# Patient Record
Sex: Male | Born: 1980 | Race: Asian | Hispanic: No | Marital: Married | Smoking: Never smoker
Health system: Southern US, Community
[De-identification: ages and names within clinical notes are randomized; demographics above are authoritative.]

---

## 2008-04-03 ENCOUNTER — Emergency Department (HOSPITAL_COMMUNITY): Admission: EM | Admit: 2008-04-03 | Discharge: 2008-04-03 | Payer: Self-pay | Admitting: Emergency Medicine

## 2008-08-26 ENCOUNTER — Emergency Department (HOSPITAL_COMMUNITY): Admission: EM | Admit: 2008-08-26 | Discharge: 2008-08-26 | Payer: Self-pay | Admitting: Emergency Medicine

## 2009-03-21 ENCOUNTER — Emergency Department (HOSPITAL_COMMUNITY): Admission: EM | Admit: 2009-03-21 | Discharge: 2009-03-21 | Payer: Self-pay | Admitting: Emergency Medicine

## 2011-10-31 ENCOUNTER — Emergency Department (INDEPENDENT_AMBULATORY_CARE_PROVIDER_SITE_OTHER)
Admission: EM | Admit: 2011-10-31 | Discharge: 2011-10-31 | Disposition: A | Discharge status: Home / Self Care | Payer: PRIVATE HEALTH INSURANCE | Source: Home / Self Care

## 2011-10-31 DIAGNOSIS — J069 Acute upper respiratory infection, unspecified: Secondary | ICD-10-CM

## 2011-10-31 NOTE — ED Provider Notes (Signed)
History     CSN: [id number] Arrival date & time: 10/31/2011  2:24 PM   None     Chief Complaint  Patient presents with  . Fever    pt has had fever, chills and sorethroat for 5 days    (Consider location/radiation/quality/duration/timing/severity/associated sxs/prior treatment) HPI Comments: Onset of fever, cough and mild clear rhinorrhea 5 days ago. Subjective fever. No sore throat or ear pain. Cough is not disruptive to sleep.  Taking Tylenol for symptoms with improvement.   Patient is a 30 y.o. male presenting with fever. The history is provided by the patient.  Fever Primary symptoms of the febrile illness include fever and cough. Primary symptoms do not include wheezing, shortness of breath, abdominal pain, nausea, vomiting, diarrhea or myalgias. The current episode started 3 to 5 days ago. This is a new problem. The problem has not changed since onset. The fever began 3 to 5 days ago. The fever has been unchanged since its onset. The maximum temperature recorded prior to his arrival was unknown.  The cough began 3 to 5 days ago. The cough is new. The cough is non-productive.    No past medical history on file.  No past surgical history on file.  No family history on file.  History  Substance Use Topics  . Smoking status: Not on file  . Smokeless tobacco: Not on file  . Alcohol Use: Not on file      Review of Systems  Constitutional: Positive for fever and chills.  HENT: Positive for congestion and rhinorrhea. Negative for ear pain and sore throat.   Respiratory: Positive for cough. Negative for shortness of breath and wheezing.   Cardiovascular: Negative for chest pain.  Gastrointestinal: Negative for nausea, vomiting, abdominal pain and diarrhea.  Musculoskeletal: Negative for myalgias.    Allergies  Review of patient's allergies indicates no known allergies.  Home Medications   Current Outpatient Rx  Name Route Sig Dispense Refill  . ACETAMINOPHEN 325 MG  PO TABS Oral Take 650 mg by mouth every 6 (six) hours as needed.        BP 105/70  Pulse 85  Temp(Src) 99.2 F (37.3 C) (Oral)  Resp 16  SpO2 100%  Physical Exam  Nursing note and vitals reviewed. Constitutional: He appears well-developed and well-nourished. No distress.  HENT:  Head: Normocephalic and atraumatic.  Right Ear: Tympanic membrane, external ear and ear canal normal.  Left Ear: Tympanic membrane, external ear and ear canal normal.  Nose: Nose normal.  Mouth/Throat: Uvula is midline, oropharynx is clear and moist and mucous membranes are normal. No oropharyngeal exudate, posterior oropharyngeal edema or posterior oropharyngeal erythema.  Neck: Neck supple.  Cardiovascular: Normal rate, regular rhythm and normal heart sounds.   Pulmonary/Chest: Effort normal and breath sounds normal. No respiratory distress.  Lymphadenopathy:    He has no cervical adenopathy.  Neurological: He is alert.  Skin: Skin is warm and dry.  Psychiatric: He has a normal mood and affect.    ED Course  Procedures (including critical care time)  Labs Reviewed - No data to display No results found.   No diagnosis found.    MDM          Melody Comas, PA 10/31/11 1550

## 2011-10-31 NOTE — ED Provider Notes (Signed)
Medical screening examination/treatment/procedure(s) were performed by non-physician practitioner and as supervising physician I was immediately available for consultation/collaboration.  Raynald Blend, MD 10/31/11 [phone]

## 2012-12-14 ENCOUNTER — Emergency Department (HOSPITAL_COMMUNITY)
Admission: EM | Admit: 2012-12-14 | Discharge: 2012-12-14 | Disposition: A | Discharge status: Home / Self Care | Payer: PRIVATE HEALTH INSURANCE | Attending: Emergency Medicine | Admitting: Emergency Medicine

## 2012-12-14 ENCOUNTER — Encounter (HOSPITAL_COMMUNITY): Payer: Self-pay | Admitting: *Deleted

## 2012-12-14 ENCOUNTER — Emergency Department (HOSPITAL_COMMUNITY): Payer: PRIVATE HEALTH INSURANCE

## 2012-12-14 DIAGNOSIS — R51 Headache: Secondary | ICD-10-CM | POA: Insufficient documentation

## 2012-12-14 DIAGNOSIS — H6590 Unspecified nonsuppurative otitis media, unspecified ear: Secondary | ICD-10-CM

## 2012-12-14 DIAGNOSIS — H938X9 Other specified disorders of ear, unspecified ear: Secondary | ICD-10-CM | POA: Insufficient documentation

## 2012-12-14 DIAGNOSIS — R42 Dizziness and giddiness: Secondary | ICD-10-CM | POA: Insufficient documentation

## 2012-12-14 MED ORDER — AMOXICILLIN 500 MG PO CAPS: 500.0000 mg | ORAL_CAPSULE | Freq: Three times a day (TID) | ORAL | Status: DC
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
## 2012-12-14 NOTE — ED Notes (Signed)
Patient transported to CT 
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
## 2012-12-15 NOTE — ED Provider Notes (Signed)
Medical screening examination/treatment/procedure(s) were performed by non-physician practitioner and as supervising physician I was immediately available for consultation/collaboration.   Glynn Octave, MD 12/15/12 [phone]

## 2014-02-15 IMAGING — CT CT HEAD W/O CM
1 series · 16 of 30 positions shown, 20 images · non-contrast
Comparison: None.

CLINICAL DATA: Head injury with dizziness and headache.

CT HEAD WITHOUT CONTRAST
TECHNIQUE: Contiguous axial images were obtained from the base of
the skull through the vertex without contrast.

[Series 2: head trauma 4.8 h37s · axial · 0.43mm/px · z∈[-115,+18]mm · 16 of 30 slices shown, 20 images]
[im 2/30  brain]
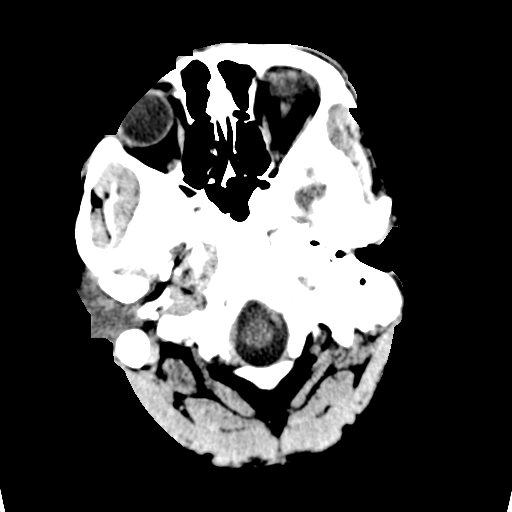
[im 2/30  bone]
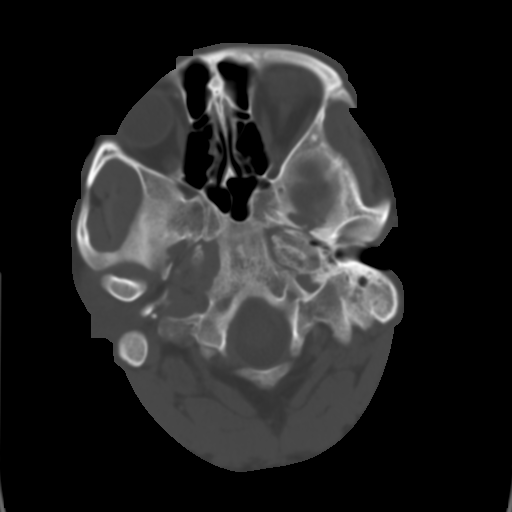
[im 4/30  brain]
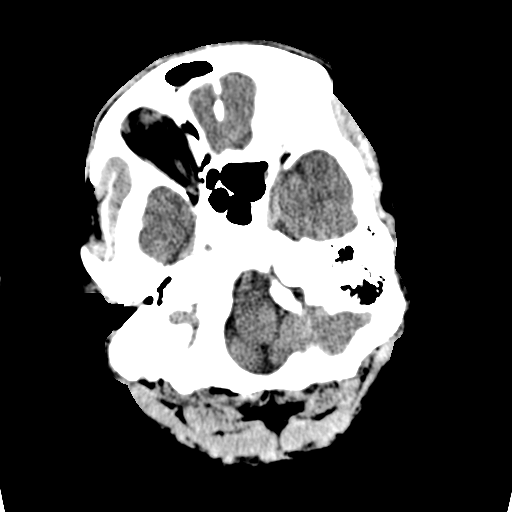
[im 6/30  brain]
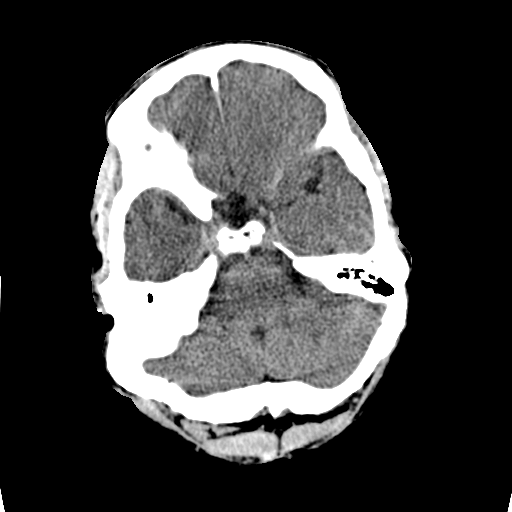
[im 8/30  brain]
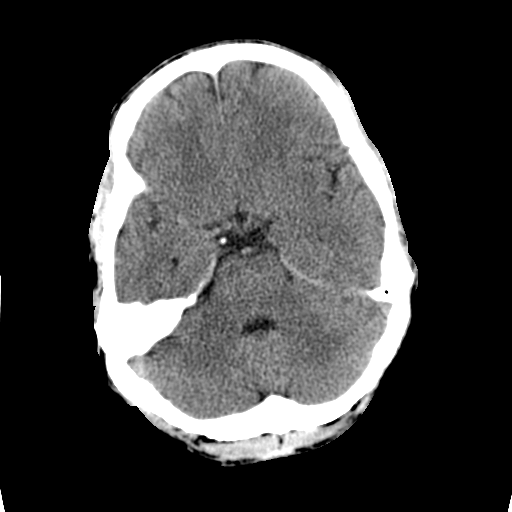
[im 9/30  brain]
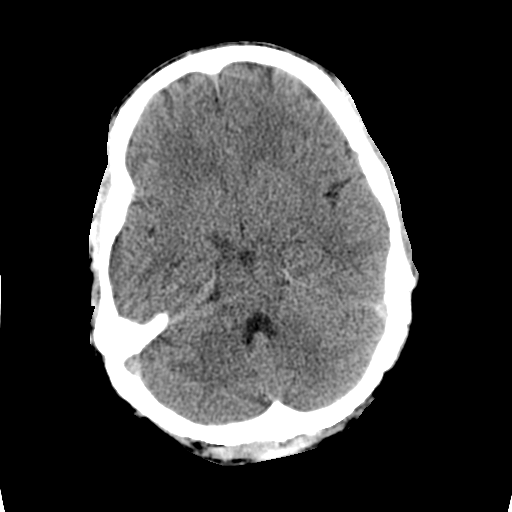
[im 9/30  bone]
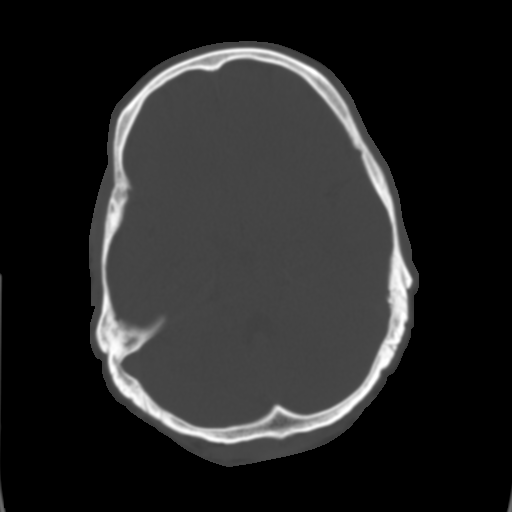
[im 11/30  brain]
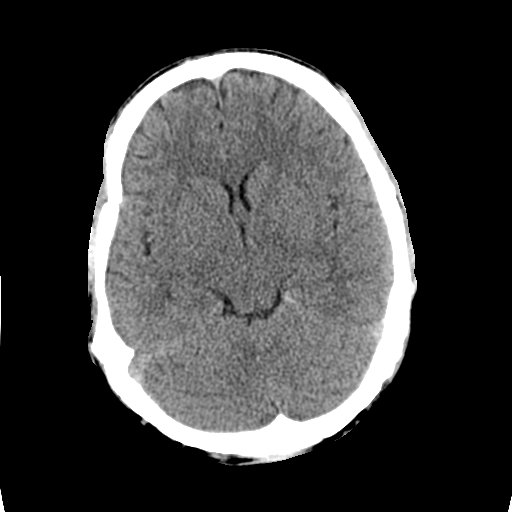
[im 13/30  brain]
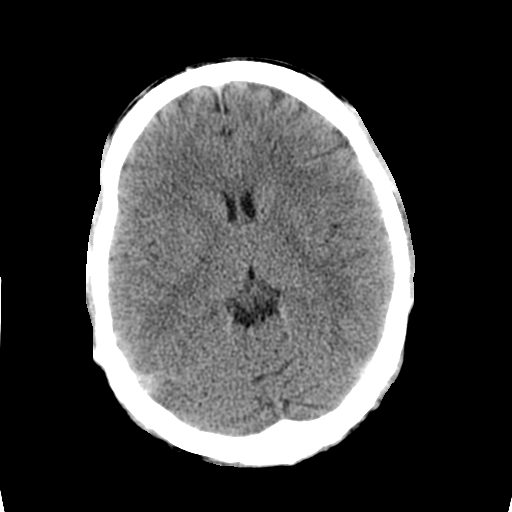
[im 15/30  brain]
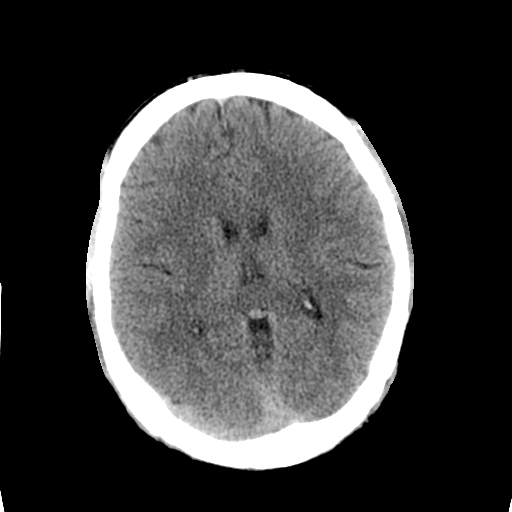
[im 16/30  brain]
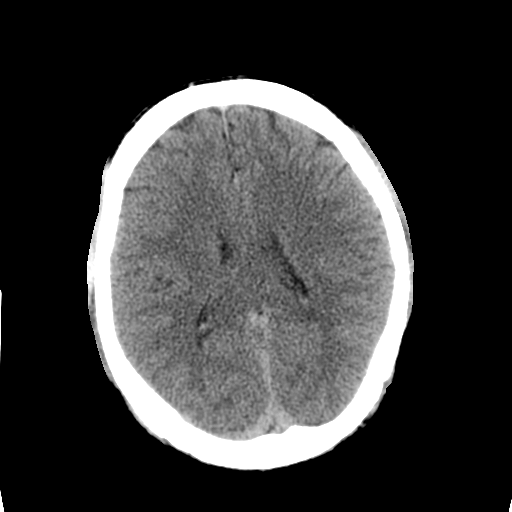
[im 16/30  bone]
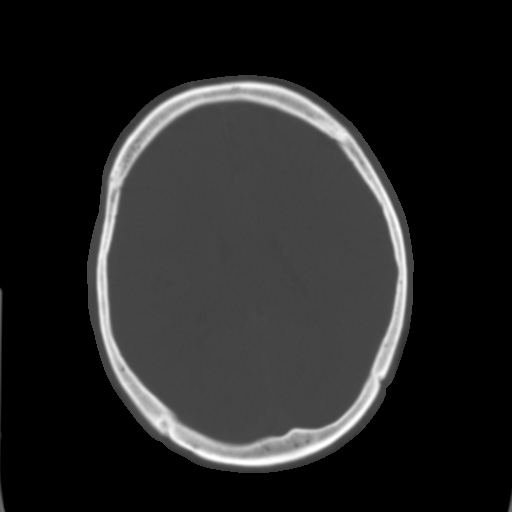
[im 18/30  brain]
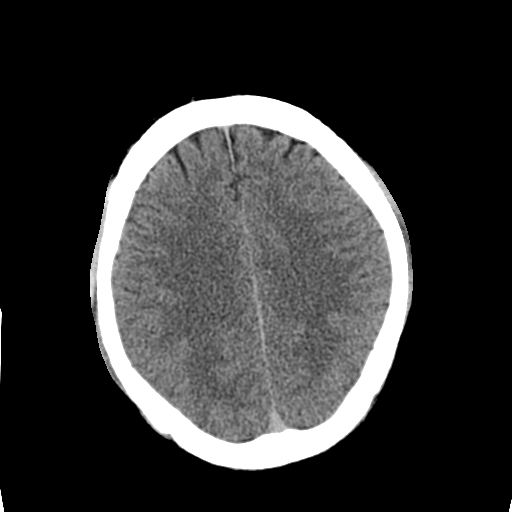
[im 20/30  brain]
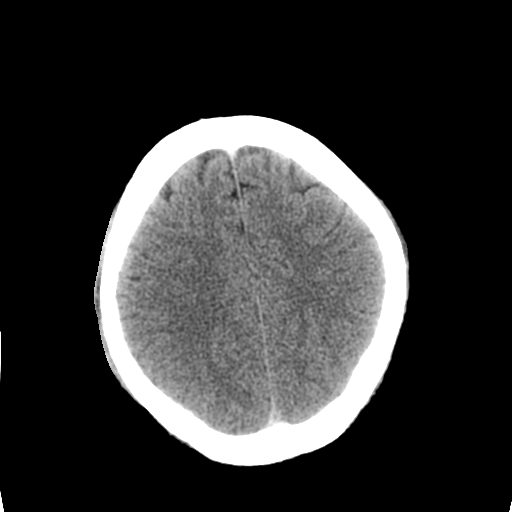
[im 22/30  brain]
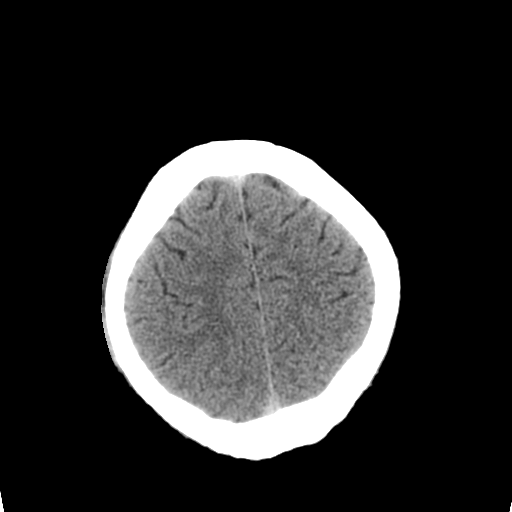
[im 23/30  brain]
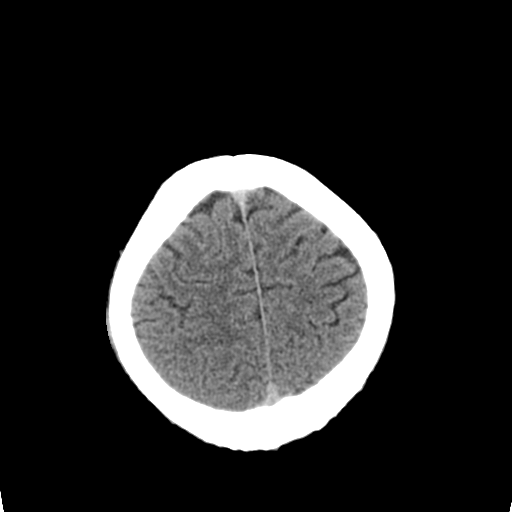
[im 23/30  bone]
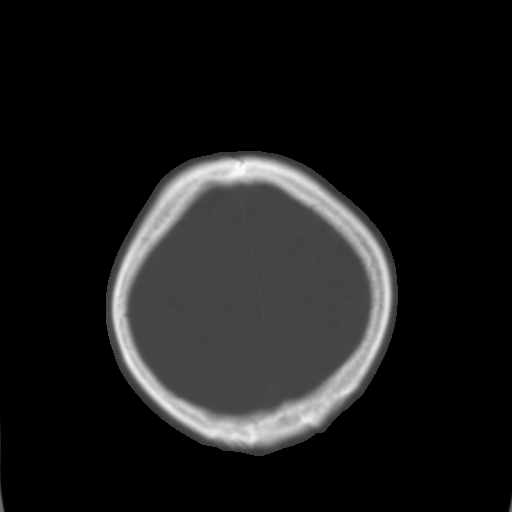
[im 25/30  brain]
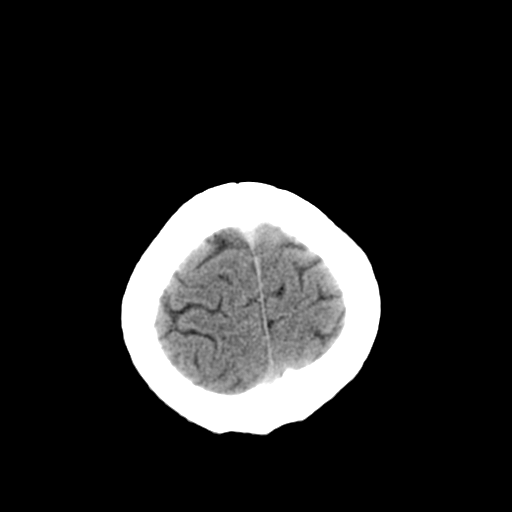
[im 27/30  brain]
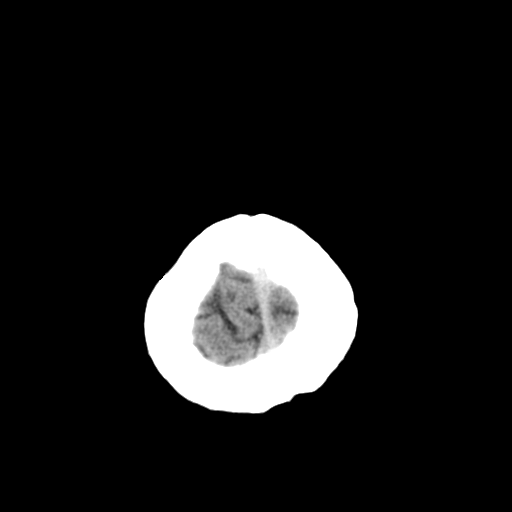
[im 29/30  brain]
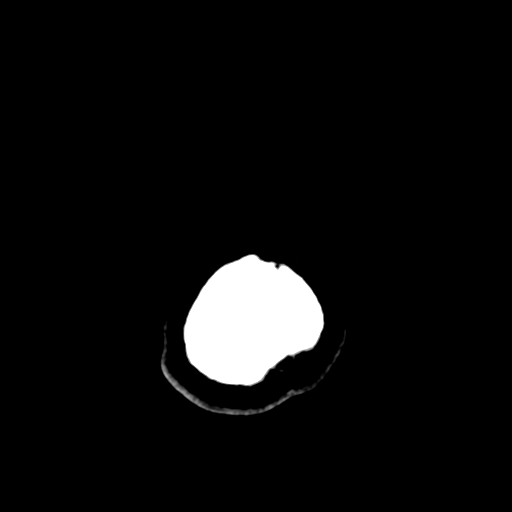

[16 of 30 positions shown; findings below may reference images not displayed]

FINDINGS: Normal appearance of the intracranial structures.  There
is no evidence for acute hemorrhage, mass lesion, midline shift or
hydrocephalus.  Normal appearance of the ventricles and basal
cisterns. No evidence for a large infarct.

There appears to be fluid within the right middle ear.  There is no
evidence for an acute calvarial fracture.  Unclear if the right
mastoid air cells are hypoplastic or if there is some fluid in this
area.  Visualized paranasal sinuses are clear.
IMPRESSION: No acute intracranial abnormality.

Evidence for opacification or fluid in the right middle ear.

## 2015-02-08 ENCOUNTER — Emergency Department (HOSPITAL_COMMUNITY)
Admission: EM | Admit: 2015-02-08 | Discharge: 2015-02-08 | Disposition: A | Discharge status: Home / Self Care | Payer: PRIVATE HEALTH INSURANCE | Source: Home / Self Care | Attending: Emergency Medicine | Admitting: Emergency Medicine

## 2015-02-08 ENCOUNTER — Encounter (HOSPITAL_COMMUNITY): Payer: Self-pay | Admitting: Emergency Medicine

## 2015-02-08 DIAGNOSIS — J329 Chronic sinusitis, unspecified: Secondary | ICD-10-CM | POA: Diagnosis not present

## 2015-02-08 MED ORDER — FEXOFENADINE-PSEUDOEPHED ER 60-120 MG PO TB12: 1.0000 | ORAL_TABLET | Freq: Two times a day (BID) | ORAL | Status: DC

## 2015-02-08 MED ORDER — AMOXICILLIN 875 MG PO TABS: 875.0000 mg | ORAL_TABLET | Freq: Two times a day (BID) | ORAL | Status: DC

## 2015-02-08 MED ORDER — FLUTICASONE PROPIONATE 50 MCG/ACT NA SUSP: 2.0000 | Freq: Two times a day (BID) | NASAL | Status: DC

## 2015-02-08 MED ORDER — PREDNISONE 10 MG PO TABS: ORAL_TABLET | ORAL | Status: DC

## 2015-02-08 MED ORDER — OLOPATADINE HCL 0.2 % OP SOLN: OPHTHALMIC | Status: DC

## 2015-02-08 NOTE — ED Notes (Signed)
C/o headache, runny nose, no cough, has sore throat, eyes hurt: reports symptoms for 3 weeks

## 2015-02-08 NOTE — Discharge Instructions (Signed)
It is unclear whether your symptoms are being caused by allergies or by a sinus infection. Start taking the allergy medications on the first page and if you are not getting better in 3 or 4 days then go start taking the antibiotic on the second page.   Sinusitis Sinusitis is redness, soreness, and inflammation of the paranasal sinuses. Paranasal sinuses are air pockets within the bones of your face (beneath the eyes, the middle of the forehead, or above the eyes). In healthy paranasal sinuses, mucus is able to drain out, and air is able to circulate through them by way of your nose. However, when your paranasal sinuses are inflamed, mucus and air can become trapped. This can allow bacteria and other germs to grow and cause infection. Sinusitis can develop quickly and last only a short time (acute) or continue over a long period (chronic). Sinusitis that lasts for more than 12 weeks is considered chronic.  CAUSES  Causes of sinusitis include:  Allergies.  Structural abnormalities, such as displacement of the cartilage that separates your nostrils (deviated septum), which can decrease the air flow through your nose and sinuses and affect sinus drainage.  Functional abnormalities, such as when the small hairs (cilia) that line your sinuses and help remove mucus do not work properly or are not present. SIGNS AND SYMPTOMS  Symptoms of acute and chronic sinusitis are the same. The primary symptoms are pain and pressure around the affected sinuses. Other symptoms include:  Upper toothache.  Earache.  Headache.  Bad breath.  Decreased sense of smell and taste.  A cough, which worsens when you are lying flat.  Fatigue.  Fever.  Thick drainage from your nose, which often is green and may contain pus (purulent).  Swelling and warmth over the affected sinuses. DIAGNOSIS  Your health care provider will perform a physical exam. During the exam, your health care provider may:  Look in your  nose for signs of abnormal growths in your nostrils (nasal polyps).  Tap over the affected sinus to check for signs of infection.  View the inside of your sinuses (endoscopy) using an imaging device that has a light attached (endoscope). If your health care provider suspects that you have chronic sinusitis, one or more of the following tests may be recommended:  Allergy tests.  Nasal culture. A sample of mucus is taken from your nose, sent to a lab, and screened for bacteria.  Nasal cytology. A sample of mucus is taken from your nose and examined by your health care provider to determine if your sinusitis is related to an allergy. TREATMENT  Most cases of acute sinusitis are related to a viral infection and will resolve on their own within 10 days. Sometimes medicines are prescribed to help relieve symptoms (pain medicine, decongestants, nasal steroid sprays, or saline sprays).  However, for sinusitis related to a bacterial infection, your health care provider will prescribe antibiotic medicines. These are medicines that will help kill the bacteria causing the infection.  Rarely, sinusitis is caused by a fungal infection. In theses cases, your health care provider will prescribe antifungal medicine. For some cases of chronic sinusitis, surgery is needed. Generally, these are cases in which sinusitis recurs more than 3 times per year, despite other treatments. HOME CARE INSTRUCTIONS   Drink plenty of water. Water helps thin the mucus so your sinuses can drain more easily.  Use a humidifier.  Inhale steam 3 to 4 times a day (for example, sit in the bathroom with the  shower running).  Apply a warm, moist washcloth to your face 3 to 4 times a day, or as directed by your health care provider.  Use saline nasal sprays to help moisten and clean your sinuses.  Take medicines only as directed by your health care provider.  If you were prescribed either an antibiotic or antifungal medicine,  finish it all even if you start to feel better. SEEK IMMEDIATE MEDICAL CARE IF:  You have increasing pain or severe headaches.  You have nausea, vomiting, or drowsiness.  You have swelling around your face.  You have vision problems.  You have a stiff neck.  You have difficulty breathing. MAKE SURE YOU:   Understand these instructions.  Will watch your condition.  Will get help right away if you are not doing well or get worse. Document Released: 11/08/2005 Document Revised: 03/25/2014 Document Reviewed: 11/23/2011 [facility] [employer] Patient Information 2015 [employer]. This information is not intended to replace advice given to you by your health care provider. Make sure you discuss any questions you have with your health care provider.

## 2015-02-08 NOTE — ED Provider Notes (Signed)
CSN: [id number]     Arrival date & time 02/08/15  1834 History   None    Chief Complaint  Patient presents with  . URI   (Consider location/radiation/quality/duration/timing/severity/associated sxs/prior Treatment) HPI      34 year old male presents for evaluation of allergies. He has nasal congestion, rhinorrhea, itchy eyes, itchy nose, headaches, and intermittent ear pain. His symptoms have been going on for about 3 weeks. He tried getting some over-the-counter allergy medicine which helped with the rhinorrhea but none of the other symptoms. He has also had some blood in his nasal drainage on occasion. He has a slight cough is dry. No chest pain or shortness of breath. No fever. No history of sinus infections.  History reviewed. No pertinent past medical history. History reviewed. No pertinent past surgical history. No family history on file. History  Substance Use Topics  . Smoking status: Never Smoker   . Smokeless tobacco: Not on file  . Alcohol Use: No    Review of Systems  Constitutional: Negative for fever, chills and fatigue.  HENT: Positive for congestion, ear pain, postnasal drip, rhinorrhea, sinus pressure and sore throat. Negative for ear discharge.   Eyes: Negative for visual disturbance.  Respiratory: Positive for cough. Negative for shortness of breath.   Cardiovascular: Negative for chest pain.  Gastrointestinal: Negative for nausea, vomiting and diarrhea.  Musculoskeletal: Negative for myalgias.  Neurological: Positive for headaches.  All other systems reviewed and are negative.   Allergies  Review of patient's allergies indicates no known allergies.  Home Medications   Prior to Admission medications   Medication Sig Start Date End Date Taking? Authorizing Provider  amoxicillin (AMOXIL) 500 MG capsule Take 1 capsule (500 mg total) by mouth 3 (three) times daily. Patient not taking: Reported on 02/08/2015 12/14/12   Marlon Peliffany Greene, PA-C  amoxicillin (AMOXIL)  875 MG tablet Take 1 tablet (875 mg total) by mouth 2 (two) times daily. 02/08/15   Adrian BlackwaterZachary H Ernest Orr, PA-C  fexofenadine-pseudoephedrine (ALLEGRA-D) 60-120 MG per tablet Take 1 tablet by mouth every 12 (twelve) hours. 02/08/15   Adrian BlackwaterZachary H Aidynn Polendo, PA-C  fluticasone (FLONASE) 50 MCG/ACT nasal spray Place 2 sprays into both nostrils 2 (two) times daily. Decrease to 2 sprays/nostril daily after 5 days 02/08/15   Graylon GoodZachary H Doaa Kendzierski, PA-C  Olopatadine HCl (PATADAY) 0.2 % SOLN 1 drop per eye once daily as needed for redness, itching, or irritation 02/08/15   Graylon GoodZachary H Hibba Schram, PA-C  predniSONE (DELTASONE) 10 MG tablet 4 tabs PO QD for 4 days; 3 tabs PO QD for 3 days; 2 tabs PO QD for 2 days; 1 tab PO QD for 1 day 02/08/15   Graylon GoodZachary H Ryder Man, PA-C   BP 108/75 mmHg  Pulse 62  Temp(Src) 98.2 F (36.8 C) (Oral)  Resp 16  SpO2 98% Physical Exam  Constitutional: He is oriented to person, place, and time. He appears well-developed and well-nourished. No distress.  HENT:  Head: Normocephalic and atraumatic.  Right Ear: Tympanic membrane, external ear and ear canal normal.  Left Ear: External ear and ear canal normal. Tympanic membrane is injected.  Nose: Right sinus exhibits maxillary sinus tenderness. Right sinus exhibits no frontal sinus tenderness. Left sinus exhibits maxillary sinus tenderness. Left sinus exhibits no frontal sinus tenderness.  Mouth/Throat: Uvula is midline, oropharynx is clear and moist and mucous membranes are normal. No oropharyngeal exudate or posterior oropharyngeal erythema.  Eyes: Conjunctivae are normal.  Neck: Normal range of motion. Neck supple.  Pulmonary/Chest: Effort normal. No  respiratory distress.  Lymphadenopathy:    He has no cervical adenopathy.  Neurological: He is alert and oriented to person, place, and time. Coordination normal.  Skin: Skin is warm and dry. No rash noted. He is not diaphoretic.  Psychiatric: He has a normal mood and affect. Judgment normal.  Nursing note  and vitals reviewed.   ED Course  Procedures (including critical care time) Labs Review Labs Reviewed - No data to display  Imaging Review No results found.   MDM   1. Rhinosinusitis    Sinusitis versus allergies. Treat for allergies, if no improvement in a few days without then start amoxicillin. Follow-up when necessary  Meds ordered this encounter  Medications  . DISCONTD: predniSONE (DELTASONE) 10 MG tablet    Sig: 4 tabs PO QD for 4 days; 3 tabs PO QD for 3 days; 2 tabs PO QD for 2 days; 1 tab PO QD for 1 day    Dispense:  30 tablet    Refill:  0  . DISCONTD: fluticasone (FLONASE) 50 MCG/ACT nasal spray    Sig: Place 2 sprays into both nostrils 2 (two) times daily. Decrease to 2 sprays/nostril daily after 5 days    Dispense:  16 g    Refill:  2  . DISCONTD: fexofenadine-pseudoephedrine (ALLEGRA-D) 60-120 MG per tablet    Sig: Take 1 tablet by mouth every 12 (twelve) hours.    Dispense:  30 tablet    Refill:  0  . amoxicillin (AMOXIL) 875 MG tablet    Sig: Take 1 tablet (875 mg total) by mouth 2 (two) times daily.    Dispense:  14 tablet    Refill:  0  . fexofenadine-pseudoephedrine (ALLEGRA-D) 60-120 MG per tablet    Sig: Take 1 tablet by mouth every 12 (twelve) hours.    Dispense:  30 tablet    Refill:  0  . fluticasone (FLONASE) 50 MCG/ACT nasal spray    Sig: Place 2 sprays into both nostrils 2 (two) times daily. Decrease to 2 sprays/nostril daily after 5 days    Dispense:  16 g    Refill:  2  . predniSONE (DELTASONE) 10 MG tablet    Sig: 4 tabs PO QD for 4 days; 3 tabs PO QD for 3 days; 2 tabs PO QD for 2 days; 1 tab PO QD for 1 day    Dispense:  30 tablet    Refill:  0  . Olopatadine HCl (PATADAY) 0.2 % SOLN    Sig: 1 drop per eye once daily as needed for redness, itching, or irritation    Dispense:  2.5 mL    Refill:  0       Graylon Good, PA-C 02/08/15 [id number]

## 2019-04-28 ENCOUNTER — Other Ambulatory Visit: Payer: Self-pay | Admitting: *Deleted

## 2019-04-28 DIAGNOSIS — Z20822 Contact with and (suspected) exposure to covid-19: Secondary | ICD-10-CM

## 2019-04-30 LAB — NOVEL CORONAVIRUS, NAA: SARS-CoV-2, NAA: NOT DETECTED

## 2020-07-23 DIAGNOSIS — Z419 Encounter for procedure for purposes other than remedying health state, unspecified: Secondary | ICD-10-CM | POA: Diagnosis not present

## 2020-08-22 DIAGNOSIS — Z419 Encounter for procedure for purposes other than remedying health state, unspecified: Secondary | ICD-10-CM | POA: Diagnosis not present

## 2020-09-22 DIAGNOSIS — Z419 Encounter for procedure for purposes other than remedying health state, unspecified: Secondary | ICD-10-CM | POA: Diagnosis not present

## 2020-10-22 DIAGNOSIS — Z419 Encounter for procedure for purposes other than remedying health state, unspecified: Secondary | ICD-10-CM | POA: Diagnosis not present

## 2020-11-22 DIAGNOSIS — Z419 Encounter for procedure for purposes other than remedying health state, unspecified: Secondary | ICD-10-CM | POA: Diagnosis not present

## 2020-12-23 DIAGNOSIS — Z419 Encounter for procedure for purposes other than remedying health state, unspecified: Secondary | ICD-10-CM | POA: Diagnosis not present

## 2021-01-20 DIAGNOSIS — Z419 Encounter for procedure for purposes other than remedying health state, unspecified: Secondary | ICD-10-CM | POA: Diagnosis not present

## 2021-02-20 DIAGNOSIS — Z419 Encounter for procedure for purposes other than remedying health state, unspecified: Secondary | ICD-10-CM | POA: Diagnosis not present

## 2021-03-22 DIAGNOSIS — Z419 Encounter for procedure for purposes other than remedying health state, unspecified: Secondary | ICD-10-CM | POA: Diagnosis not present

## 2021-04-22 DIAGNOSIS — Z419 Encounter for procedure for purposes other than remedying health state, unspecified: Secondary | ICD-10-CM | POA: Diagnosis not present

## 2021-05-22 DIAGNOSIS — Z419 Encounter for procedure for purposes other than remedying health state, unspecified: Secondary | ICD-10-CM | POA: Diagnosis not present

## 2021-06-22 DIAGNOSIS — Z419 Encounter for procedure for purposes other than remedying health state, unspecified: Secondary | ICD-10-CM | POA: Diagnosis not present

## 2021-07-23 DIAGNOSIS — Z419 Encounter for procedure for purposes other than remedying health state, unspecified: Secondary | ICD-10-CM | POA: Diagnosis not present

## 2021-08-22 DIAGNOSIS — Z419 Encounter for procedure for purposes other than remedying health state, unspecified: Secondary | ICD-10-CM | POA: Diagnosis not present

## 2021-09-22 DIAGNOSIS — Z419 Encounter for procedure for purposes other than remedying health state, unspecified: Secondary | ICD-10-CM | POA: Diagnosis not present

## 2021-10-22 DIAGNOSIS — Z419 Encounter for procedure for purposes other than remedying health state, unspecified: Secondary | ICD-10-CM | POA: Diagnosis not present

## 2021-11-02 ENCOUNTER — Ambulatory Visit (HOSPITAL_COMMUNITY)
Admission: EM | Admit: 2021-11-02 | Discharge: 2021-11-02 | Disposition: A | Discharge status: Home / Self Care | Payer: Medicaid Other | Attending: Physician Assistant | Admitting: Physician Assistant

## 2021-11-02 ENCOUNTER — Other Ambulatory Visit: Payer: Self-pay

## 2021-11-02 ENCOUNTER — Encounter (HOSPITAL_COMMUNITY): Payer: Self-pay | Admitting: Emergency Medicine

## 2021-11-02 DIAGNOSIS — G479 Sleep disorder, unspecified: Secondary | ICD-10-CM | POA: Diagnosis not present

## 2021-11-02 DIAGNOSIS — G44209 Tension-type headache, unspecified, not intractable: Secondary | ICD-10-CM | POA: Diagnosis not present

## 2021-11-02 DIAGNOSIS — H538 Other visual disturbances: Secondary | ICD-10-CM | POA: Diagnosis not present

## 2021-11-02 MED ORDER — NAPROXEN 375 MG PO TABS: 375.0000 mg | ORAL_TABLET | Freq: Two times a day (BID) | ORAL | 0 refills | Status: DC

## 2021-11-02 MED ORDER — BACLOFEN 10 MG PO TABS: 10.0000 mg | ORAL_TABLET | Freq: Every evening | ORAL | 0 refills | Status: DC | PRN

## 2021-11-02 NOTE — ED Provider Notes (Signed)
[place]    CSN: [id number] Arrival date & time: 11/02/21  1544      History   Chief Complaint Chief Complaint  Patient presents with   Headache    HPI Ladaris Besse is a 40 y.o. male.   Patient presents today with a 4-day history of recurrent headaches.  He has a history of intermittent headaches throughout his life but reports typically these respond to over-the-counter analgesics such as Tylenol but current episode has not.  He does report several life stressors as he is now working a second/third shift from and sleeping much less.  He denies any head injury or medication changes prior to symptom onset.  He does report gradual worsening of your vision and has experienced some blurred vision for the past 4 months.  He has not seen an optometrist/ophthalmologist and does not wear glasses or contacts.  He has not tried any other over-the-counter medication besides Tylenol.  He denies any recent illness including cough, congestion, sinus pressure.  He denies any focal neurological symptoms including weakness, numbness, paresthesias, dysarthria, vision loss.  He denies history of neurological condition.  Reports headache pain during episodes is rated 8 on a 0-10 pain scale, localized to entire head with radiation into the neck, described as aching with periodic sharp pains, no aggravating or alleviating factors identified.   History reviewed. No pertinent past medical history.  There are no problems to display for this patient.   History reviewed. No pertinent surgical history.     Home Medications    Prior to Admission medications   Medication Sig Start Date End Date Taking? Authorizing Provider  baclofen (LIORESAL) 10 MG tablet Take 1 tablet (10 mg total) by mouth at bedtime as needed for muscle spasms. 11/02/21  Yes Marlos Carmen K, PA-C  naproxen (NAPROSYN) 375 MG tablet Take 1 tablet (375 mg total) by mouth 2 (two) times daily. 11/02/21  Yes Virlee Stroschein, Derry Skill, PA-C     Family History History reviewed. No pertinent family history.  Social History Social History   Tobacco Use   Smoking status: Never  Substance Use Topics   Alcohol use: No   Drug use: No     Allergies   Patient has no known allergies.   Review of Systems Review of Systems  Constitutional:  Positive for activity change. Negative for appetite change, fatigue and fever.  Eyes:  Positive for visual disturbance (Bilateral blurred vision over several months). Negative for photophobia.  Respiratory:  Negative for cough and shortness of breath.   Cardiovascular:  Negative for chest pain.  Gastrointestinal:  Negative for abdominal pain, diarrhea, nausea and vomiting.  Musculoskeletal:  Negative for arthralgias and myalgias.  Neurological:  Positive for headaches. Negative for dizziness, seizures, syncope, speech difficulty, weakness and light-headedness.    Physical Exam Triage Vital Signs ED Triage Vitals  Enc Vitals Group     BP 11/02/21 1712 (!) 131/94     Pulse Rate 11/02/21 1712 64     Resp 11/02/21 1712 17     Temp 11/02/21 1712 98 F (36.7 C)     Temp Source 11/02/21 1712 Oral     SpO2 11/02/21 1712 98 %     Weight --      Height --      Head Circumference --      Peak Flow --      Pain Score 11/02/21 1710 0     Pain Loc --      Pain Edu? --  Excl. in [place]? --    No data found.  Updated Vital Signs BP (!) 131/94 (BP Location: Right Arm)   Pulse 64   Temp 98 F (36.7 C) (Oral)   Resp 17   SpO2 98%   Visual Acuity Right Eye Distance: 20/25 Left Eye Distance:   Bilateral Distance: 20/20  Right Eye Near:   Left Eye Near:  L Near: 20/25 Bilateral Near:     Physical Exam Vitals reviewed.  Constitutional:      General: He is awake.     Appearance: Normal appearance. He is well-developed. He is not ill-appearing.     Comments: Very pleasant male appears stated age in no acute distress  HENT:     Head: Normocephalic and atraumatic.     Right  Ear: Tympanic membrane, ear canal and external ear normal. No hemotympanum.     Left Ear: Tympanic membrane, ear canal and external ear normal. No hemotympanum.     Nose: Nose normal.     Mouth/Throat:     Tongue: Tongue does not deviate from midline.     Pharynx: Uvula midline. No oropharyngeal exudate or posterior oropharyngeal erythema.  Eyes:     Extraocular Movements: Extraocular movements intact.     Pupils: Pupils are equal, round, and reactive to light. Pupils are equal.  Cardiovascular:     Rate and Rhythm: Normal rate and regular rhythm.     Heart sounds: Normal heart sounds, S1 normal and S2 normal. No murmur heard. Pulmonary:     Effort: Pulmonary effort is normal. No accessory muscle usage or respiratory distress.     Breath sounds: Normal breath sounds. No stridor. No wheezing, rhonchi or rales.     Comments: Clear to auscultation bilaterally Musculoskeletal:     Cervical back: Normal range of motion and neck supple.     Comments: Strength 5/5 bilateral upper and lower extremities  Neurological:     General: No focal deficit present.     Mental Status: He is alert.     Cranial Nerves: Cranial nerves 2-12 are intact.     Motor: Motor function is intact.     Coordination: Coordination is intact.     Comments: Cranial nerves II through XII intact.  No focal neurological defect on exam.  Psychiatric:        Behavior: Behavior is cooperative.     UC Treatments / Results  Labs (all labs ordered are listed, but only abnormal results are displayed) Labs Reviewed - No data to display  EKG   Radiology No results found.  Procedures Procedures (including critical care time)  Medications Ordered in UC Medications - No data to display  Initial Impression / Assessment and Plan / UC Course  I have reviewed the triage vital signs and the nursing notes.  Pertinent labs & imaging results that were available during my care of the patient were reviewed by me and considered  in my medical decision making (see chart for details).     Vital signs and physical exam reassuring today; no indication for emergent evaluation or imaging.  Discussed that symptoms are likely multifactorial related to sleep disturbance, stress, vision changes/need for glasses.  He was encouraged to follow-up with optometrist/ophthalmologist and given contact information for local provider.  Tylenol has not been managing symptoms so we will try NSAIDs.  Patient was given Naprosyn with instruction not to take additional NSAIDs including aspirin, ibuprofen/Advil, naproxen/Aleve due to risk of GI bleeding.  He can use Tylenol  for breakthrough pain.  He was given baclofen to be used at night with instruction not to drive or drink alcohol while taking this medication.  He does not currently have a PCP we will try to establish him with a PCP via PCP assistance for ongoing follow-up.  He was given work excuse note for several days and encouraged to rest and drink plenty of fluid.  Discussed at length alarm symptoms that warrant emergent evaluation.  Strict return precautions given to which she expressed understanding.  Final Clinical Impressions(s) / UC Diagnoses   Final diagnoses:  Tension headache  Blurred vision, bilateral  Sleep disturbance     Discharge Instructions      Take Naprosyn twice daily for pain.  Do not take additional NSAIDs including aspirin, ibuprofen/Advil, naproxen/Aleve with this medication.  You can continue taking Tylenol as needed.  Take baclofen at night.  This make you sleepy do not drive or drink alcohol while taking this.  Try to get 8 hours of sleep make sure that you are drinking plenty of fluid and eating small frequent meals.  I do think that your vision could be contributing to your symptoms to follow-up with an eye doctor.  We will try to establish you with your primary care doctor but if you have persistent symptoms please return.  If you have any sudden severe symptoms  including severe headache, nausea, vomiting, weakness in 1 part of your body, difficulty speaking, vision loss you need to go to the emergency room as we discussed.     ED Prescriptions     Medication Sig Dispense Auth. Provider   naproxen (NAPROSYN) 375 MG tablet Take 1 tablet (375 mg total) by mouth 2 (two) times daily. 20 tablet Giovannie Scerbo K, PA-C   baclofen (LIORESAL) 10 MG tablet Take 1 tablet (10 mg total) by mouth at bedtime as needed for muscle spasms. 10 each Cruz Bong, Noberto Retort, PA-C      PDMP not reviewed this encounter.   Jeani Hawking, PA-C 11/02/21 1832

## 2021-11-02 NOTE — ED Triage Notes (Signed)
Pt reports since Friday having headaches and faitgue. Taking tylenol for headaches and was working.  Reports that he can't see very well up close since last August. Reports that he had a change in his shift at work and not getting lots of sleep.

## 2021-11-02 NOTE — Discharge Instructions (Signed)
Take Naprosyn twice daily for pain.  Do not take additional NSAIDs including aspirin, ibuprofen/Advil, naproxen/Aleve with this medication.  You can continue taking Tylenol as needed.  Take baclofen at night.  This make you sleepy do not drive or drink alcohol while taking this.  Try to get 8 hours of sleep make sure that you are drinking plenty of fluid and eating small frequent meals.  I do think that your vision could be contributing to your symptoms to follow-up with an eye doctor.  We will try to establish you with your primary care doctor but if you have persistent symptoms please return.  If you have any sudden severe symptoms including severe headache, nausea, vomiting, weakness in 1 part of your body, difficulty speaking, vision loss you need to go to the emergency room as we discussed.

## 2021-11-22 DIAGNOSIS — Z419 Encounter for procedure for purposes other than remedying health state, unspecified: Secondary | ICD-10-CM | POA: Diagnosis not present

## 2021-12-23 DIAGNOSIS — Z419 Encounter for procedure for purposes other than remedying health state, unspecified: Secondary | ICD-10-CM | POA: Diagnosis not present

## 2022-01-20 DIAGNOSIS — Z419 Encounter for procedure for purposes other than remedying health state, unspecified: Secondary | ICD-10-CM | POA: Diagnosis not present

## 2022-02-20 DIAGNOSIS — Z419 Encounter for procedure for purposes other than remedying health state, unspecified: Secondary | ICD-10-CM | POA: Diagnosis not present

## 2022-03-22 DIAGNOSIS — Z419 Encounter for procedure for purposes other than remedying health state, unspecified: Secondary | ICD-10-CM | POA: Diagnosis not present

## 2022-04-22 DIAGNOSIS — Z419 Encounter for procedure for purposes other than remedying health state, unspecified: Secondary | ICD-10-CM | POA: Diagnosis not present

## 2022-05-22 DIAGNOSIS — Z419 Encounter for procedure for purposes other than remedying health state, unspecified: Secondary | ICD-10-CM | POA: Diagnosis not present

## 2022-06-22 DIAGNOSIS — Z419 Encounter for procedure for purposes other than remedying health state, unspecified: Secondary | ICD-10-CM | POA: Diagnosis not present

## 2022-07-23 DIAGNOSIS — Z419 Encounter for procedure for purposes other than remedying health state, unspecified: Secondary | ICD-10-CM | POA: Diagnosis not present

## 2022-08-22 DIAGNOSIS — Z419 Encounter for procedure for purposes other than remedying health state, unspecified: Secondary | ICD-10-CM | POA: Diagnosis not present

## 2022-09-22 DIAGNOSIS — Z419 Encounter for procedure for purposes other than remedying health state, unspecified: Secondary | ICD-10-CM | POA: Diagnosis not present

## 2022-10-22 DIAGNOSIS — Z419 Encounter for procedure for purposes other than remedying health state, unspecified: Secondary | ICD-10-CM | POA: Diagnosis not present

## 2022-11-15 DIAGNOSIS — H5213 Myopia, bilateral: Secondary | ICD-10-CM | POA: Diagnosis not present

## 2022-11-22 DIAGNOSIS — Z419 Encounter for procedure for purposes other than remedying health state, unspecified: Secondary | ICD-10-CM | POA: Diagnosis not present

## 2022-12-01 ENCOUNTER — Encounter (HOSPITAL_COMMUNITY): Payer: Self-pay

## 2022-12-01 ENCOUNTER — Ambulatory Visit (HOSPITAL_COMMUNITY)
Admission: EM | Admit: 2022-12-01 | Discharge: 2022-12-01 | Disposition: A | Discharge status: Home / Self Care | Payer: Medicaid Other

## 2022-12-01 DIAGNOSIS — B349 Viral infection, unspecified: Secondary | ICD-10-CM

## 2022-12-01 NOTE — Discharge Instructions (Signed)
Over the counter banadryl at night as needed  Rest and fluids Your symptoms and exam are consistent for a viral illness. Please treat your symptoms with over the counter cough medication, tylenol or ibuprofen, humidifier, and rest. Viral illnesses can last 7-14 days. Please follow up with your PCP if your symptoms are not improving. Please go to the ER for any worsening symptoms. This includes but is not limited to fever you can not control with tylenol or ibuprofen, you are not able to stay hydrated, you have shortness of breath or chest pain.  Thank you for choosing [facility] for your healthcare needs. I hope you feel better soon!

## 2022-12-01 NOTE — ED Provider Notes (Signed)
[place]    CSN: [id number] Arrival date & time: 12/01/22  1648      History   Chief Complaint Chief Complaint  Patient presents with   Allergies    HPI Matthew Barajas is a 42 y.o. male who presents for evaluation of URI symptoms for 5 days. Patient reports associated symptoms of headache, cough, chills, body aches, runny nose. Denies N/V/D, documented fevers, ear pain, sore throat, shortness of breath.  Reports having a hard time sleeping at night due to the headache.  Denies it is the worst headache of his life.  Patient does not have a hx of asthma or smoking. No known sick contacts and no recent travel. Pt is  vaccinated for COVID. Pt is not vaccinated for flu this season. Pt has taken claritin D twice daily OTC for symptoms. Pt has no other concerns at this time.   HPI  History reviewed. No pertinent past medical history.  There are no problems to display for this patient.   History reviewed. No pertinent surgical history.     Home Medications    Prior to Admission medications   Medication Sig Start Date End Date Taking? Authorizing Provider  baclofen (LIORESAL) 10 MG tablet Take 1 tablet (10 mg total) by mouth at bedtime as needed for muscle spasms. 11/02/21   Raspet, Derry Skill, PA-C  naproxen (NAPROSYN) 375 MG tablet Take 1 tablet (375 mg total) by mouth 2 (two) times daily. 11/02/21   Raspet, Derry Skill, PA-C    Family History History reviewed. No pertinent family history.  Social History Social History   Tobacco Use   Smoking status: Never   Smokeless tobacco: Never  Vaping Use   Vaping Use: Never used  Substance Use Topics   Alcohol use: No   Drug use: No     Allergies   Patient has no known allergies.   Review of Systems Review of Systems  Constitutional:  Positive for chills.  HENT:  Positive for rhinorrhea.   Respiratory:  Positive for cough.   Musculoskeletal:  Positive for myalgias.     Physical Exam Triage Vital Signs ED Triage  Vitals  Enc Vitals Group     BP 12/01/22 1715 126/79     Pulse Rate 12/01/22 1715 87     Resp 12/01/22 1715 16     Temp 12/01/22 1715 98.4 F (36.9 C)     Temp Source 12/01/22 1715 Oral     SpO2 12/01/22 1715 98 %     Weight 12/01/22 1715 147 lb (66.7 kg)     Height 12/01/22 1715 5' (1.524 m)     Head Circumference --      Peak Flow --      Pain Score 12/01/22 1714 0     Pain Loc --      Pain Edu? --      Excl. in [place]? --    No data found.  Updated Vital Signs BP 126/79 (BP Location: Left Arm)   Pulse 87   Temp 98.4 F (36.9 C) (Oral)   Resp 16   Ht 5' (1.524 m)   Wt 147 lb (66.7 kg)   SpO2 98%   BMI 28.71 kg/m   Visual Acuity Right Eye Distance:   Left Eye Distance:   Bilateral Distance:    Right Eye Near:   Left Eye Near:    Bilateral Near:     Physical Exam Vitals and nursing note reviewed.  Constitutional:  General: He is not in acute distress.    Appearance: Normal appearance. He is not ill-appearing or toxic-appearing.  HENT:     Head: Normocephalic and atraumatic.     Right Ear: Tympanic membrane and ear canal normal.     Left Ear: Tympanic membrane and ear canal normal.     Nose: Rhinorrhea present. No congestion.     Mouth/Throat:     Mouth: Mucous membranes are moist.     Pharynx: No oropharyngeal exudate or posterior oropharyngeal erythema.  Eyes:     Pupils: Pupils are equal, round, and reactive to light.  Cardiovascular:     Rate and Rhythm: Normal rate and regular rhythm.     Heart sounds: Normal heart sounds.  Pulmonary:     Effort: Pulmonary effort is normal.     Breath sounds: Normal breath sounds.  Musculoskeletal:     Cervical back: Normal range of motion and neck supple.  Lymphadenopathy:     Cervical: No cervical adenopathy.  Skin:    General: Skin is warm and dry.  Neurological:     General: No focal deficit present.     Mental Status: He is alert and oriented to person, place, and time.  Psychiatric:        Mood and  Affect: Mood normal.        Behavior: Behavior normal.      UC Treatments / Results  Labs (all labs ordered are listed, but only abnormal results are displayed) Labs Reviewed - No data to display  EKG   Radiology No results found.  Procedures Procedures (including critical care time)  Medications Ordered in UC Medications - No data to display  Initial Impression / Assessment and Plan / UC Course  I have reviewed the triage vital signs and the nursing notes.  Pertinent labs & imaging results that were available during my care of the patient were reviewed by me and considered in my medical decision making (see chart for details).     Reviewed exam and symptoms with patient.  Discussed likely viral illness as cause of symptoms Advise Claritin in the morning and may take Benadryl at night as needed for sleep.  Advised stay hydrated while taking antihistamines Rest and fluids Follow-up with PCP 2 to 3 days for recheck ER precautions reviewed and patient verbalized understanding Final Clinical Impressions(s) / UC Diagnoses   Final diagnoses:  Viral illness     Discharge Instructions      Over the counter banadryl at night as needed  Rest and fluids Your symptoms and exam are consistent for a viral illness. Please treat your symptoms with over the counter cough medication, tylenol or ibuprofen, humidifier, and rest. Viral illnesses can last 7-14 days. Please follow up with your PCP if your symptoms are not improving. Please go to the ER for any worsening symptoms. This includes but is not limited to fever you can not control with tylenol or ibuprofen, you are not able to stay hydrated, you have shortness of breath or chest pain.  Thank you for choosing [facility] for your healthcare needs. I hope you feel better soon!    ED Prescriptions   None    PDMP not reviewed this encounter.   Melynda Ripple, NP 12/01/22 1736

## 2022-12-01 NOTE — ED Triage Notes (Addendum)
Chief Complaint: Patient having headache, runny nose, fatigue. Fever and chills at night. Patient has history of seasonal allergies.    Onset: This past Saturday   Prescriptions or OTC medications tried: Yes- Claritin D, tylenol    with mild relief  Sick exposure: No  New foods, medications, or products: No  Recent Travel: No

## 2022-12-23 DIAGNOSIS — Z419 Encounter for procedure for purposes other than remedying health state, unspecified: Secondary | ICD-10-CM | POA: Diagnosis not present

## 2023-01-21 DIAGNOSIS — Z419 Encounter for procedure for purposes other than remedying health state, unspecified: Secondary | ICD-10-CM | POA: Diagnosis not present

## 2023-02-21 DIAGNOSIS — Z419 Encounter for procedure for purposes other than remedying health state, unspecified: Secondary | ICD-10-CM | POA: Diagnosis not present

## 2023-03-23 DIAGNOSIS — Z419 Encounter for procedure for purposes other than remedying health state, unspecified: Secondary | ICD-10-CM | POA: Diagnosis not present

## 2023-04-23 DIAGNOSIS — Z419 Encounter for procedure for purposes other than remedying health state, unspecified: Secondary | ICD-10-CM | POA: Diagnosis not present

## 2023-05-23 DIAGNOSIS — Z419 Encounter for procedure for purposes other than remedying health state, unspecified: Secondary | ICD-10-CM | POA: Diagnosis not present

## 2023-06-23 DIAGNOSIS — Z419 Encounter for procedure for purposes other than remedying health state, unspecified: Secondary | ICD-10-CM | POA: Diagnosis not present

## 2023-06-28 DIAGNOSIS — J302 Other seasonal allergic rhinitis: Secondary | ICD-10-CM | POA: Diagnosis not present

## 2023-07-07 ENCOUNTER — Encounter (HOSPITAL_COMMUNITY): Payer: Self-pay | Admitting: *Deleted

## 2023-07-07 ENCOUNTER — Ambulatory Visit (HOSPITAL_COMMUNITY)
Admission: EM | Admit: 2023-07-07 | Discharge: 2023-07-07 | Disposition: A | Discharge status: Home / Self Care | Payer: Medicaid Other

## 2023-07-07 DIAGNOSIS — J0140 Acute pansinusitis, unspecified: Secondary | ICD-10-CM | POA: Diagnosis not present

## 2023-07-07 MED ORDER — AMOXICILLIN-POT CLAVULANATE 875-125 MG PO TABS: 1.0000 | ORAL_TABLET | Freq: Two times a day (BID) | ORAL | 0 refills | Status: AC

## 2023-07-07 NOTE — Discharge Instructions (Addendum)
You have a sinus infection.  Take the Augmentin as prescribed to treat it.  Stop taking the Claritin-D as I believe this is reaching up your blood pressure.  You can perform sinus rinses-instructions have been attached.

## 2023-07-07 NOTE — ED Triage Notes (Signed)
Pt states he has allergies he was seen at [facility] 06/28/2023 and given singular and it is not helping. He complains of headache, sore throat, cough and congestion x 3 weeks. He states he is also taking Claritin D which helps but makes him dry.

## 2023-07-07 NOTE — ED Provider Notes (Signed)
[facility]    CSN: [id number] Arrival date & time: 07/07/23  1352      History   Chief Complaint Chief Complaint  Patient presents with   Cough   Nasal Congestion   Headache   Sore Throat    HPI Matthew Barajas is a 42 y.o. male.   Patient presents today with 3-week history of low-grade fevers at home, congested cough with mucus production, runny nose, sore throat, headache, ear pain, sinus pressure in his cheeks and above his eyes, and fatigue.  He denies body aches or chills, shortness of breath or chest pain, abdominal pain, nausea/vomiting, and diarrhea.  Reports he was seen by an alternate urgent care, started on Claritin-D, Singulair, and NyQuil without improvement in symptoms.  Denies history of chronic lung disease.    History reviewed. No pertinent past medical history.  There are no problems to display for this patient.   History reviewed. No pertinent surgical history.     Home Medications    Prior to Admission medications   Medication Sig Start Date End Date Taking? Authorizing Provider  amoxicillin-clavulanate (AUGMENTIN) 875-125 MG tablet Take 1 tablet by mouth 2 (two) times daily for 7 days. 07/07/23 07/14/23 Yes Cathlean Marseilles A, NP  montelukast (SINGULAIR) 10 MG tablet Take by mouth. 06/28/23 07/28/23 Yes [provider]  baclofen (LIORESAL) 10 MG tablet Take 1 tablet (10 mg total) by mouth at bedtime as needed for muscle spasms. 11/02/21   Raspet, Noberto Retort, PA-C  naproxen (NAPROSYN) 375 MG tablet Take 1 tablet (375 mg total) by mouth 2 (two) times daily. 11/02/21   Raspet, Noberto Retort, PA-C    Family History History reviewed. No pertinent family history.  Social History Social History   Tobacco Use   Smoking status: Never   Smokeless tobacco: Never  Vaping Use   Vaping status: Never Used  Substance Use Topics   Alcohol use: No   Drug use: No     Allergies   Patient has no known allergies.   Review of Systems Review of  Systems Per HPI  Physical Exam Triage Vital Signs ED Triage Vitals [07/07/23 1608]  Encounter Vitals Group     BP (!) 145/102     Systolic BP Percentile      Diastolic BP Percentile      Pulse Rate 76     Resp 18     Temp 98.2 F (36.8 C)     Temp Source Oral     SpO2 96 %     Weight      Height      Head Circumference      Peak Flow      Pain Score 0     Pain Loc      Pain Education      Exclude from Growth Chart    No data found.  Updated Vital Signs BP (!) 145/102 (BP Location: Left Arm)   Pulse 76   Temp 98.2 F (36.8 C) (Oral)   Resp 18   SpO2 96%   Visual Acuity Right Eye Distance:   Left Eye Distance:   Bilateral Distance:    Right Eye Near:   Left Eye Near:    Bilateral Near:     Physical Exam Vitals and nursing note reviewed.  Constitutional:      General: He is not in acute distress.    Appearance: Normal appearance. He is not ill-appearing or toxic-appearing.  HENT:     Head:  Normocephalic and atraumatic.     Right Ear: Ear canal and external ear normal. A middle ear effusion is present. Tympanic membrane is erythematous.     Left Ear: Ear canal and external ear normal. A middle ear effusion is present. Tympanic membrane is erythematous.     Nose: Congestion and rhinorrhea present.     Right Sinus: Maxillary sinus tenderness and frontal sinus tenderness present.     Left Sinus: Maxillary sinus tenderness and frontal sinus tenderness present.     Mouth/Throat:     Mouth: Mucous membranes are moist.     Pharynx: Oropharynx is clear. No oropharyngeal exudate or posterior oropharyngeal erythema.  Eyes:     General: No scleral icterus.    Extraocular Movements: Extraocular movements intact.  Cardiovascular:     Rate and Rhythm: Normal rate and regular rhythm.  Pulmonary:     Effort: Pulmonary effort is normal. No respiratory distress.     Breath sounds: Normal breath sounds. No wheezing, rhonchi or rales.  Abdominal:     General: Abdomen is  flat. Bowel sounds are normal.     Palpations: Abdomen is soft.  Musculoskeletal:     Cervical back: Normal range of motion and neck supple.  Lymphadenopathy:     Cervical: Cervical adenopathy present.  Skin:    General: Skin is warm and dry.     Coloration: Skin is not jaundiced or pale.     Findings: No erythema or rash.  Neurological:     Mental Status: He is alert and oriented to person, place, and time.  Psychiatric:        Behavior: Behavior is cooperative.      UC Treatments / Results  Labs (all labs ordered are listed, but only abnormal results are displayed) Labs Reviewed - No data to display  EKG   Radiology No results found.  Procedures Procedures (including critical care time)  Medications Ordered in UC Medications - No data to display  Initial Impression / Assessment and Plan / UC Course  I have reviewed the triage vital signs and the nursing notes.  Pertinent labs & imaging results that were available during my care of the patient were reviewed by me and considered in my medical decision making (see chart for details).   Patient is well-appearing, afebrile, not tachycardic, not tachypneic, oxygenating well on room air.  Patient is mildly hypertensive in urgent care today.  1. Acute non-recurrent pansinusitis Treat with Augmentin twice daily for 7 days Supportive care discussed Return precautions also discussed   The patient was given the opportunity to ask questions.  All questions answered to their satisfaction.  The patient is in agreement to this plan.   Final Clinical Impressions(s) / UC Diagnoses   Final diagnoses:  Acute non-recurrent pansinusitis     Discharge Instructions      You have a sinus infection.  Take the Augmentin as prescribed to treat it.  Stop taking the Claritin-D as I believe this is reaching up your blood pressure.  You can perform sinus rinses-instructions have been attached.     ED Prescriptions     Medication  Sig Dispense Auth. Provider   amoxicillin-clavulanate (AUGMENTIN) 875-125 MG tablet Take 1 tablet by mouth 2 (two) times daily for 7 days. 14 tablet Valentino Nose, NP      PDMP not reviewed this encounter.   Valentino Nose, NP 07/07/23 [phone]

## 2023-07-24 DIAGNOSIS — Z419 Encounter for procedure for purposes other than remedying health state, unspecified: Secondary | ICD-10-CM | POA: Diagnosis not present

## 2023-08-05 ENCOUNTER — Encounter (HOSPITAL_COMMUNITY): Payer: Self-pay

## 2023-08-05 ENCOUNTER — Ambulatory Visit (HOSPITAL_COMMUNITY)
Admission: EM | Admit: 2023-08-05 | Discharge: 2023-08-05 | Disposition: A | Discharge status: Home / Self Care | Payer: Medicaid Other | Attending: Emergency Medicine | Admitting: Emergency Medicine

## 2023-08-05 DIAGNOSIS — R519 Headache, unspecified: Secondary | ICD-10-CM

## 2023-08-05 MED ORDER — CETIRIZINE HCL 10 MG PO TABS: 10.0000 mg | ORAL_TABLET | Freq: Every day | ORAL | 0 refills | Status: AC

## 2023-08-05 MED ORDER — NAPROXEN 375 MG PO TABS: 375.0000 mg | ORAL_TABLET | Freq: Two times a day (BID) | ORAL | 0 refills | Status: DC

## 2023-08-05 NOTE — ED Provider Notes (Signed)
[facility]    CSN: [id number] Arrival date & time: 08/05/23  1803      History   Chief Complaint Chief Complaint  Patient presents with   Headache    HPI Matthew Barajas is a 42 y.o. male.   Patient presents with headache and fatigue x 3 days. Patient reports taking Tylenol with some relief.  Denies dizziness, blurred vision, nausea, weakness, and confusion.   Headache Associated symptoms: congestion and fatigue   Associated symptoms: no cough, no dizziness, no ear pain, no eye pain, no fever, no photophobia, no sore throat and no weakness     History reviewed. No pertinent past medical history.  There are no problems to display for this patient.   History reviewed. No pertinent surgical history.     Home Medications    Prior to Admission medications   Medication Sig Start Date End Date Taking? Authorizing Provider  cetirizine (ZYRTEC ALLERGY) 10 MG tablet Take 1 tablet (10 mg total) by mouth daily. 08/05/23  Yes Wynonia Lawman A, NP  baclofen (LIORESAL) 10 MG tablet Take 1 tablet (10 mg total) by mouth at bedtime as needed for muscle spasms. 11/02/21   Raspet, Erin K, PA-C  montelukast (SINGULAIR) 10 MG tablet Take by mouth. 06/28/23 07/28/23  [provider]  naproxen (NAPROSYN) 375 MG tablet Take 1 tablet (375 mg total) by mouth 2 (two) times daily. 08/05/23   Letta Kocher, NP    Family History History reviewed. No pertinent family history.  Social History Social History   Tobacco Use   Smoking status: Never   Smokeless tobacco: Never  Vaping Use   Vaping status: Never Used  Substance Use Topics   Alcohol use: No   Drug use: No     Allergies   Patient has no known allergies.   Review of Systems Review of Systems  Constitutional:  Positive for fatigue. Negative for chills and fever.  HENT:  Positive for congestion and rhinorrhea. Negative for ear pain, sore throat and trouble swallowing.   Eyes:  Negative for photophobia, pain  and visual disturbance.  Respiratory:  Negative for cough.   Neurological:  Positive for headaches. Negative for dizziness, weakness and light-headedness.     Physical Exam Triage Vital Signs ED Triage Vitals  Encounter Vitals Group     BP 08/05/23 1843 125/85     Systolic BP Percentile --      Diastolic BP Percentile --      Pulse Rate 08/05/23 1843 77     Resp 08/05/23 1843 18     Temp 08/05/23 1843 98.8 F (37.1 C)     Temp Source 08/05/23 1843 Oral     SpO2 08/05/23 1843 97 %     Weight --      Height --      Head Circumference --      Peak Flow --      Pain Score 08/05/23 1844 6     Pain Loc --      Pain Education --      Exclude from Growth Chart --    No data found.  Updated Vital Signs BP 125/85 (BP Location: Right Arm)   Pulse 77   Temp 98.8 F (37.1 C) (Oral)   Resp 18   SpO2 97%   Visual Acuity Right Eye Distance:   Left Eye Distance:   Bilateral Distance:    Right Eye Near:   Left Eye Near:    Bilateral Near:  Physical Exam Vitals and nursing note reviewed.  Constitutional:      General: He is awake. He is not in acute distress.    Appearance: Normal appearance. He is well-developed and well-groomed. He is not ill-appearing or toxic-appearing.  HENT:     Head: Normocephalic.     Right Ear: Tympanic membrane, ear canal and external ear normal.     Left Ear: Tympanic membrane, ear canal and external ear normal.     Nose: Congestion and rhinorrhea present.     Mouth/Throat:     Pharynx: Posterior oropharyngeal erythema and postnasal drip present. No pharyngeal swelling or oropharyngeal exudate.     Tonsils: No tonsillar exudate.  Eyes:     General: Lids are normal.     Extraocular Movements: Extraocular movements intact.     Pupils: Pupils are equal, round, and reactive to light.  Cardiovascular:     Rate and Rhythm: Normal rate.     Heart sounds: Normal heart sounds.  Pulmonary:     Effort: Pulmonary effort is normal. No respiratory  distress.     Breath sounds: Normal breath sounds. No wheezing.  Musculoskeletal:        General: Normal range of motion.     Cervical back: Normal range of motion.  Skin:    General: Skin is warm and dry.  Neurological:     Mental Status: He is alert and oriented to person, place, and time.     GCS: GCS eye subscore is 4. GCS verbal subscore is 5. GCS motor subscore is 6.     Cranial Nerves: Cranial nerves 2-12 are intact.     Sensory: Sensation is intact.     Motor: Motor function is intact.     Coordination: Coordination is intact.     Gait: Gait is intact.  Psychiatric:        Attention and Perception: Attention normal.        Mood and Affect: Mood normal.        Speech: Speech normal.        Behavior: Behavior normal. Behavior is cooperative.        Thought Content: Thought content normal.        Cognition and Memory: Cognition normal.        Judgment: Judgment normal.      UC Treatments / Results  Labs (all labs ordered are listed, but only abnormal results are displayed) Labs Reviewed - No data to display  EKG   Radiology No results found.  Procedures Procedures (including critical care time)  Medications Ordered in UC Medications - No data to display  Initial Impression / Assessment and Plan / UC Course  I have reviewed the triage vital signs and the nursing notes.  Pertinent labs & imaging results that were available during my care of the patient were reviewed by me and considered in my medical decision making (see chart for details).     Patient presented with headache and fatigue x 3 days.  Patient reports taking Tylenol with some relief. Denies dizziness, blurry vision with headache, nausea, weakness, and confusion. Patient endorses some congestion and runny nose. Denies cough, shortness of breath, and fever. Patient states having trouble with his vision chronically and states that he has been trying to an eye doctor. Upon assessment patient has  congestion, rhinorrhea, and mild erythema to throat. No neuro deficits noted. Recommended alternating between naproxen and Tylenol for headache. Recommended taking a daily Zyrtec to help with sinus congestion.  Discussed following up with ophthalmologist. Discussed return precautions. Final Clinical Impressions(s) / UC Diagnoses   Final diagnoses:  Sinus headache     Discharge Instructions      Alternate between Naproxen and Tylenol as needed for headache. You can also start taking Zyrtec daily to help with sinus congestion. I have attached an eye doctor you can follow up with regarding vision concerns.     ED Prescriptions     Medication Sig Dispense Auth. Provider   cetirizine (ZYRTEC ALLERGY) 10 MG tablet Take 1 tablet (10 mg total) by mouth daily. 30 tablet Wynonia Lawman A, NP   naproxen (NAPROSYN) 375 MG tablet Take 1 tablet (375 mg total) by mouth 2 (two) times daily. 20 tablet Wynonia Lawman A, NP      PDMP not reviewed this encounter.   Wynonia Lawman A, NP 08/05/23 [phone]

## 2023-08-05 NOTE — ED Triage Notes (Signed)
Pt c/o headache and fatigue x3 days. States took tylenol this morning with relief.

## 2023-08-05 NOTE — Discharge Instructions (Signed)
Alternate between Naproxen and Tylenol as needed for headache. You can also start taking Zyrtec daily to help with sinus congestion. I have attached an eye doctor you can follow up with regarding vision concerns.

## 2023-08-15 ENCOUNTER — Encounter: Payer: Self-pay | Admitting: Nurse Practitioner

## 2023-08-15 ENCOUNTER — Ambulatory Visit (INDEPENDENT_AMBULATORY_CARE_PROVIDER_SITE_OTHER): Payer: Medicaid Other | Admitting: Nurse Practitioner

## 2023-08-15 VITALS — BP 137/81 | HR 94 | Ht 63.39 in | Wt 171.6 lb

## 2023-08-15 DIAGNOSIS — Z1329 Encounter for screening for other suspected endocrine disorder: Secondary | ICD-10-CM

## 2023-08-15 DIAGNOSIS — Z130 Encounter for screening for diseases of the blood and blood-forming organs and certain disorders involving the immune mechanism: Secondary | ICD-10-CM

## 2023-08-15 DIAGNOSIS — G44229 Chronic tension-type headache, not intractable: Secondary | ICD-10-CM | POA: Diagnosis not present

## 2023-08-15 DIAGNOSIS — H538 Other visual disturbances: Secondary | ICD-10-CM | POA: Diagnosis not present

## 2023-08-15 DIAGNOSIS — Z1321 Encounter for screening for nutritional disorder: Secondary | ICD-10-CM

## 2023-08-15 DIAGNOSIS — N3281 Overactive bladder: Secondary | ICD-10-CM | POA: Diagnosis not present

## 2023-08-15 DIAGNOSIS — Z13228 Encounter for screening for other metabolic disorders: Secondary | ICD-10-CM | POA: Diagnosis not present

## 2023-08-15 DIAGNOSIS — R350 Frequency of micturition: Secondary | ICD-10-CM | POA: Insufficient documentation

## 2023-08-15 LAB — POCT GLYCOSYLATED HEMOGLOBIN (HGB A1C): Hemoglobin A1C: 5.2 % (ref 4.0–5.6)

## 2023-08-15 LAB — POCT URINALYSIS DIP (CLINITEK)
Bilirubin, UA: NEGATIVE
Blood, UA: NEGATIVE
Glucose, UA: NEGATIVE mg/dL
Leukocytes, UA: NEGATIVE
Nitrite, UA: NEGATIVE
POC PROTEIN,UA: NEGATIVE
Spec Grav, UA: 1.02 (ref 1.010–1.025)
Urobilinogen, UA: 1 E.U./dL
pH, UA: 7 (ref 5.0–8.0)

## 2023-08-15 MED ORDER — OXYBUTYNIN CHLORIDE ER 10 MG PO TB24: 10.0000 mg | ORAL_TABLET | Freq: Every day | ORAL | 1 refills | Status: DC

## 2023-08-15 MED ORDER — NORTRIPTYLINE HCL 10 MG PO CAPS: 10.0000 mg | ORAL_CAPSULE | Freq: Every day | ORAL | 1 refills | Status: DC

## 2023-08-15 NOTE — Assessment & Plan Note (Signed)
UA negative for UTI A1c is normal no diabetes Start Ditropan 10 mg daily Encouraged to avoid drinking water close to bedtime

## 2023-08-15 NOTE — Assessment & Plan Note (Addendum)
Start Pamelor 10 mg daily at bedtime Take naproxen or Tylenol as needed Need for adequate rest and adequate hydration discussed Follow-up in 4 weeks Checking CBC, CMP, TSH

## 2023-08-15 NOTE — Assessment & Plan Note (Signed)
List of ophthalmologist that accepts Medicaid provided today.

## 2023-08-15 NOTE — Patient Instructions (Signed)
1. Screening for endocrine, nutritional, metabolic and immunity disorder  - CBC with Differential/Platelet - CMP14+EGFR  2. Chronic tension-type headache, not intractable  - nortriptyline (PAMELOR) 10 MG capsule; Take 1 capsule (10 mg total) by mouth at bedtime.  Dispense: 60 capsule; Refill: 1  3. Urinary frequency  - oxybutynin (DITROPAN-XL) 10 MG 24 hr tablet; Take 1 tablet (10 mg total) by mouth at bedtime.  Dispense: 60 tablet; Refill: 1     It is important that you exercise regularly at least 30 minutes 5 times a week as tolerated  Think about what you will eat, plan ahead. Choose " clean, green, fresh or frozen" over canned, processed or packaged foods which are more sugary, salty and fatty. 70 to 75% of food eaten should be vegetables and fruit. Three meals at set times with snacks allowed between meals, but they must be fruit or vegetables. Aim to eat over a 12 hour period , example 7 am to 7 pm, and STOP after  your last meal of the day. Drink water,generally about 64 ounces per day, no other drink is as healthy. Fruit juice is best enjoyed in a healthy way, by EATING the fruit.  Thanks for choosing [facility] we consider it a privelige to serve you.

## 2023-08-15 NOTE — Progress Notes (Signed)
New Patient Office Visit  Subjective:  Patient ID: Matthew Barajas, male    DOB: December 03, 1980  Age: 42 y.o. MRN: [id number]  CC:  Chief Complaint  Patient presents with   New Patient (Initial Visit)   Urinary Frequency    During the night mostly     HPI Matthew Barajas is a 42 y.o. male with past medical history of tension headache who presents to establish care.  Has no PCP.   Tension headache .patient complains of headache that started about 2 weeks ago.  Headache is located to the top of his head radiating down back to his neck.  Stated that he had this type of headache about 2 years ago.  His headache is rated 4/10 at the moment.  He has been taking OTC Tylenol and naproxen that was recently ordered at urgent care.  Has chronic, stated that he saw the ophthalmologist over a year ago was prescribed prescription glasses but he still cannot see well a prescription glasses, he would like to see another ophthalmologist that will take Medicaid.  Patient denies fever, chills, malaise, chest pain, shortness of numbness, tingling, he has 2 jobs and has been expressing a lot of stress at work.  He gets more than 4 hours of sleep due to his work.    Patient complains of worsening urinary frequency, states that he wakes up 4-6 times at night to urinate.  He denies dysuria, abdominal pain, flank pain, hematuria, hesitancy.         Past Medical History:  Diagnosis Date   Blurry vision, bilateral 08/15/2023   Chronic tension-type headache, not intractable 08/15/2023   Urinary frequency 08/15/2023    History reviewed. No pertinent surgical history.  History reviewed. No pertinent family history.  Social History   Socioeconomic History   Marital status: Married    Spouse name: Not on file   Number of children: 5   Years of education: Not on file   Highest education level: Not on file  Occupational History   Not on file  Tobacco Use   Smoking status: Never   Smokeless tobacco: Never  Vaping  Use   Vaping status: Never Used  Substance and Sexual Activity   Alcohol use: No   Drug use: No   Sexual activity: Not Currently  Other Topics Concern   Not on file  Social History Narrative   Lives with his wife and family.    Social Determinants of Health   Financial Resource Strain: Not on file  Food Insecurity: Not on file  Transportation Needs: Not on file  Physical Activity: Not on file  Stress: Not on file  Social Connections: Not on file  Intimate Partner Violence: Not on file    ROS Review of Systems  Constitutional:  Negative for activity change, appetite change, chills, diaphoresis, fatigue, fever and unexpected weight change.  HENT:  Negative for congestion, dental problem, drooling and ear discharge.   Eyes:  Positive for visual disturbance. Negative for pain, discharge, redness and itching.  Respiratory:  Negative for apnea, cough, choking, chest tightness, shortness of breath and wheezing.   Cardiovascular: Negative.  Negative for chest pain, palpitations and leg swelling.  Gastrointestinal:  Negative for abdominal distention, abdominal pain, anal bleeding, blood in stool, constipation, diarrhea and vomiting.  Endocrine: Negative for polydipsia, polyphagia and polyuria.  Genitourinary:  Negative for difficulty urinating, flank pain, frequency and genital sores.  Musculoskeletal: Negative.  Negative for arthralgias, back pain, gait problem and joint swelling.  Skin:  Negative for color change, pallor and rash.  Neurological:  Positive for headaches. Negative for dizziness, facial asymmetry, light-headedness and numbness.  Psychiatric/Behavioral:  Negative for agitation, behavioral problems, confusion, hallucinations, self-injury, sleep disturbance and suicidal ideas.     Objective:   Today's Vitals: BP 137/81   Pulse 94   Ht 5' 3.39" (1.61 m)   Wt 171 lb 9.6 oz (77.8 kg)   SpO2 98%   BMI 30.03 kg/m   Physical Exam Vitals and nursing note reviewed.   Constitutional:      General: He is not in acute distress.    Appearance: Normal appearance. He is obese. He is not ill-appearing, toxic-appearing or diaphoretic.  HENT:     Mouth/Throat:     Mouth: Mucous membranes are moist.     Pharynx: Oropharynx is clear. No oropharyngeal exudate or posterior oropharyngeal erythema.  Eyes:     General: No scleral icterus.       Right eye: No discharge.        Left eye: No discharge.     Extraocular Movements: Extraocular movements intact.     Conjunctiva/sclera: Conjunctivae normal.  Cardiovascular:     Rate and Rhythm: Normal rate and regular rhythm.     Pulses: Normal pulses.     Heart sounds: Normal heart sounds. No murmur heard.    No friction rub. No gallop.  Pulmonary:     Effort: Pulmonary effort is normal. No respiratory distress.     Breath sounds: Normal breath sounds. No stridor. No wheezing, rhonchi or rales.  Chest:     Chest wall: No tenderness.  Abdominal:     General: There is no distension.     Palpations: Abdomen is soft.     Tenderness: There is no abdominal tenderness. There is no right CVA tenderness, left CVA tenderness or guarding.  Musculoskeletal:        General: No swelling, tenderness, deformity or signs of injury.     Right lower leg: No edema.     Left lower leg: No edema.  Skin:    General: Skin is warm and dry.     Capillary Refill: Capillary refill takes less than 2 seconds.     Coloration: Skin is not jaundiced or pale.     Findings: No bruising, erythema or lesion.  Neurological:     Mental Status: He is alert and oriented to person, place, and time.     Motor: No weakness.     Coordination: Coordination normal.     Gait: Gait normal.  Psychiatric:        Mood and Affect: Mood normal.        Behavior: Behavior normal.        Thought Content: Thought content normal.        Judgment: Judgment normal.     Assessment & Plan:   Problem List Items Addressed This Visit       Other   Urinary  frequency    UA negative for UTI A1c is normal no diabetes Start Ditropan 10 mg daily Encouraged to avoid drinking water close to bedtime      Relevant Medications   oxybutynin (DITROPAN-XL) 10 MG 24 hr tablet   Other Relevant Orders   POCT URINALYSIS DIP (CLINITEK) (Completed)   POCT glycosylated hemoglobin (Hb A1C) (Completed)   Chronic tension-type headache, not intractable - Primary    Start Pamelor 10 mg daily at bedtime Take naproxen or Tylenol as needed Need for adequate  rest and adequate hydration discussed Follow-up in 4 weeks Checking CBC, CMP, TSH      Relevant Medications   nortriptyline (PAMELOR) 10 MG capsule   Blurry vision, bilateral    List of ophthalmologist that accepts Medicaid provided today.      Other Visit Diagnoses     Screening for endocrine, nutritional, metabolic and immunity disorder       Relevant Orders   CBC with Differential/Platelet   CMP14+EGFR   TSH       Outpatient Encounter Medications as of 08/15/2023  Medication Sig   cetirizine (ZYRTEC ALLERGY) 10 MG tablet Take 1 tablet (10 mg total) by mouth daily.   naproxen (NAPROSYN) 375 MG tablet Take 1 tablet (375 mg total) by mouth 2 (two) times daily.   nortriptyline (PAMELOR) 10 MG capsule Take 1 capsule (10 mg total) by mouth at bedtime.   oxybutynin (DITROPAN-XL) 10 MG 24 hr tablet Take 1 tablet (10 mg total) by mouth at bedtime.   baclofen (LIORESAL) 10 MG tablet Take 1 tablet (10 mg total) by mouth at bedtime as needed for muscle spasms. (Patient not taking: Reported on 08/15/2023)   montelukast (SINGULAIR) 10 MG tablet Take by mouth.   No facility-administered encounter medications on file as of 08/15/2023.    Follow-up: Return in about 4 weeks (around 09/12/2023) for HA, .   Donell Beers, FNP

## 2023-08-16 LAB — CBC WITH DIFFERENTIAL/PLATELET
Basophils Absolute: 0.1 10*3/uL (ref 0.0–0.2)
Basos: 1 %
EOS (ABSOLUTE): 0 10*3/uL (ref 0.0–0.4)
Eos: 0 %
Hematocrit: 50.4 % (ref 37.5–51.0)
Hemoglobin: 16 g/dL (ref 13.0–17.7)
Immature Grans (Abs): 0.1 10*3/uL (ref 0.0–0.1)
Immature Granulocytes: 1 %
Lymphocytes Absolute: 1.9 10*3/uL (ref 0.7–3.1)
Lymphs: 22 %
MCH: 25.8 pg — ABNORMAL LOW (ref 26.6–33.0)
MCHC: 31.7 g/dL (ref 31.5–35.7)
MCV: 81 fL (ref 79–97)
Monocytes Absolute: 0.5 10*3/uL (ref 0.1–0.9)
Monocytes: 6 %
Neutrophils Absolute: 6 10*3/uL (ref 1.4–7.0)
Neutrophils: 70 %
Platelets: 316 10*3/uL (ref 150–450)
RBC: 6.19 x10E6/uL — ABNORMAL HIGH (ref 4.14–5.80)
RDW: 15.7 % — ABNORMAL HIGH (ref 11.6–15.4)
WBC: 8.5 10*3/uL (ref 3.4–10.8)

## 2023-08-16 LAB — CMP14+EGFR
ALT: 47 IU/L — ABNORMAL HIGH (ref 0–44)
AST: 25 IU/L (ref 0–40)
Albumin: 4.6 g/dL (ref 4.1–5.1)
Alkaline Phosphatase: 71 IU/L (ref 44–121)
BUN/Creatinine Ratio: 13 (ref 9–20)
BUN: 13 mg/dL (ref 6–24)
Bilirubin Total: 0.5 mg/dL (ref 0.0–1.2)
CO2: 24 mmol/L (ref 20–29)
Calcium: 9.2 mg/dL (ref 8.7–10.2)
Chloride: 104 mmol/L (ref 96–106)
Creatinine, Ser: 1.03 mg/dL (ref 0.76–1.27)
Globulin, Total: 2.7 g/dL (ref 1.5–4.5)
Glucose: 86 mg/dL (ref 70–99)
Potassium: 4 mmol/L (ref 3.5–5.2)
Sodium: 144 mmol/L (ref 134–144)
Total Protein: 7.3 g/dL (ref 6.0–8.5)
eGFR: 92 mL/min/{1.73_m2} (ref >59)

## 2023-08-16 LAB — TSH: TSH: 1.05 u[IU]/mL (ref 0.450–4.500)

## 2023-08-16 NOTE — Progress Notes (Signed)
I have called the patient and reviewed  labs with him.

## 2023-09-16 ENCOUNTER — Encounter: Payer: Self-pay | Admitting: Nurse Practitioner

## 2023-09-16 ENCOUNTER — Ambulatory Visit (INDEPENDENT_AMBULATORY_CARE_PROVIDER_SITE_OTHER): Payer: Medicaid Other | Admitting: Nurse Practitioner

## 2023-09-16 VITALS — BP 120/85 | HR 90 | Temp 97.1°F | Wt 170.4 lb

## 2023-09-16 DIAGNOSIS — G44229 Chronic tension-type headache, not intractable: Secondary | ICD-10-CM | POA: Diagnosis not present

## 2023-09-16 DIAGNOSIS — N3281 Overactive bladder: Secondary | ICD-10-CM | POA: Diagnosis not present

## 2023-09-16 DIAGNOSIS — R350 Frequency of micturition: Secondary | ICD-10-CM

## 2023-09-16 MED ORDER — OXYBUTYNIN CHLORIDE ER 5 MG PO TB24: 5.0000 mg | ORAL_TABLET | Freq: Every day | ORAL | 0 refills | Status: DC

## 2023-09-16 MED ORDER — NORTRIPTYLINE HCL 10 MG PO CAPS: 10.0000 mg | ORAL_CAPSULE | Freq: Every day | ORAL | 1 refills | Status: DC

## 2023-09-16 NOTE — Assessment & Plan Note (Signed)
Well-controlled on Pamelor 10 mg daily at bedtime Continue current medication

## 2023-09-16 NOTE — Progress Notes (Signed)
Established Patient Office Visit  Subjective:  Patient ID: Matthew Barajas, male    DOB: [birth year]  Age: 42 y.o. MRN: [id number]  CC:  Chief Complaint  Patient presents with   Headache   Nocturia    HPI [name] is a 42 y.o. male  has a past medical history of Blurry vision, bilateral (08/15/2023), Chronic tension-type headache, not intractable (08/15/2023), and Urinary frequency (08/15/2023).  Patient presents for follow-up for his chronic medical conditions  Headache is now under control, takes Pamelor 10 mg daily  He continues to have urinary frequency.  Wakes up 4 times nightly to urinate.  Currently on Ditropan 10 mg daily.  No abdominal pain ,urinary hesitancy    Past Medical History:  Diagnosis Date   Blurry vision, bilateral 08/15/2023   Chronic tension-type headache, not intractable 08/15/2023   Urinary frequency 08/15/2023    History reviewed. No pertinent surgical history.  History reviewed. No pertinent family history.  Social History   Socioeconomic History   Marital status: Married    Spouse name: Not on file   Number of children: 5   Years of education: Not on file   Highest education level: Not on file  Occupational History   Not on file  Tobacco Use   Smoking status: Never   Smokeless tobacco: Never  Vaping Use   Vaping status: Never Used  Substance and Sexual Activity   Alcohol use: No   Drug use: No   Sexual activity: Not Currently  Other Topics Concern   Not on file  Social History Narrative   Lives with his wife and family.    Social Determinants of Health   Financial Resource Strain: Not on file  Food Insecurity: Not on file  Transportation Needs: Not on file  Physical Activity: Not on file  Stress: Not on file  Social Connections: Not on file  Intimate Partner Violence: Not on file    Outpatient Medications Prior to Visit  Medication Sig Dispense Refill   oxybutynin (DITROPAN-XL) 10 MG 24 hr tablet Take 1 tablet (10 mg total) by  mouth at bedtime. 60 tablet 1   nortriptyline (PAMELOR) 10 MG capsule Take 1 capsule (10 mg total) by mouth at bedtime. 60 capsule 1   cetirizine (ZYRTEC ALLERGY) 10 MG tablet Take 1 tablet (10 mg total) by mouth daily. (Patient not taking: Reported on 09/16/2023) 30 tablet 0   montelukast (SINGULAIR) 10 MG tablet Take by mouth.     naproxen (NAPROSYN) 375 MG tablet Take 1 tablet (375 mg total) by mouth 2 (two) times daily. (Patient not taking: Reported on 09/16/2023) 20 tablet 0   baclofen (LIORESAL) 10 MG tablet Take 1 tablet (10 mg total) by mouth at bedtime as needed for muscle spasms. (Patient not taking: Reported on 08/15/2023) 10 each 0   No facility-administered medications prior to visit.    No Known Allergies  ROS Review of Systems  Constitutional:  Negative for appetite change, chills, fatigue and fever.  HENT:  Negative for congestion, postnasal drip, rhinorrhea and sneezing.   Respiratory:  Negative for cough, shortness of breath and wheezing.   Cardiovascular:  Negative for chest pain, palpitations and leg swelling.  Gastrointestinal:  Negative for abdominal pain, constipation, nausea and vomiting.  Genitourinary:  Positive for frequency. Negative for difficulty urinating, dysuria and flank pain.  Musculoskeletal:  Negative for arthralgias, back pain, joint swelling and myalgias.  Skin:  Negative for color change, pallor, rash and wound.  Neurological:  Negative for  dizziness, facial asymmetry, weakness, numbness and headaches.  Psychiatric/Behavioral:  Negative for behavioral problems, confusion, self-injury and suicidal ideas.       Objective:    Physical Exam Vitals and nursing note reviewed.  Constitutional:      General: He is not in acute distress.    Appearance: Normal appearance. He is not ill-appearing, toxic-appearing or diaphoretic.  HENT:     Mouth/Throat:     Mouth: Mucous membranes are moist.     Pharynx: Oropharynx is clear. No oropharyngeal exudate or  posterior oropharyngeal erythema.  Eyes:     General: No scleral icterus.       Right eye: No discharge.        Left eye: No discharge.     Extraocular Movements: Extraocular movements intact.     Conjunctiva/sclera: Conjunctivae normal.  Cardiovascular:     Rate and Rhythm: Normal rate and regular rhythm.     Pulses: Normal pulses.     Heart sounds: Normal heart sounds. No murmur heard.    No friction rub. No gallop.  Pulmonary:     Effort: Pulmonary effort is normal. No respiratory distress.     Breath sounds: Normal breath sounds. No stridor. No wheezing, rhonchi or rales.  Chest:     Chest wall: No tenderness.  Abdominal:     General: There is no distension.     Palpations: Abdomen is soft.     Tenderness: There is no abdominal tenderness. There is no right CVA tenderness, left CVA tenderness or guarding.  Musculoskeletal:        General: No swelling, tenderness, deformity or signs of injury.     Right lower leg: No edema.     Left lower leg: No edema.  Skin:    General: Skin is warm and dry.     Capillary Refill: Capillary refill takes less than 2 seconds.     Coloration: Skin is not jaundiced or pale.     Findings: No bruising, erythema or lesion.  Neurological:     Mental Status: He is alert and oriented to person, place, and time.     Motor: No weakness.     Coordination: Coordination normal.     Gait: Gait normal.  Psychiatric:        Mood and Affect: Mood normal.        Behavior: Behavior normal.        Thought Content: Thought content normal.        Judgment: Judgment normal.     BP 120/85   Pulse 90   Temp (!) 97.1 F (36.2 C)   Wt 170 lb 6.4 oz (77.3 kg)   SpO2 99%   BMI 29.82 kg/m  Wt Readings from Last 3 Encounters:  09/16/23 170 lb 6.4 oz (77.3 kg)  08/15/23 171 lb 9.6 oz (77.8 kg)  12/01/22 147 lb (66.7 kg)    Lab Results  Component Value Date   TSH 1.050 08/15/2023   Lab Results  Component Value Date   WBC 8.5 08/15/2023   HGB 16.0  08/15/2023   HCT 50.4 08/15/2023   MCV 81 08/15/2023   PLT 316 08/15/2023   Lab Results  Component Value Date   NA 144 08/15/2023   K 4.0 08/15/2023   CO2 24 08/15/2023   GLUCOSE 86 08/15/2023   BUN 13 08/15/2023   CREATININE 1.03 08/15/2023   BILITOT 0.5 08/15/2023   ALKPHOS 71 08/15/2023   AST 25 08/15/2023   ALT 47 (H)  08/15/2023   PROT 7.3 08/15/2023   ALBUMIN 4.6 08/15/2023   CALCIUM 9.2 08/15/2023   EGFR 92 08/15/2023   No results found for: "CHOL" No results found for: "HDL" No results found for: "LDLCALC" No results found for: "TRIG" No results found for: "CHOLHDL" Lab Results  Component Value Date   HGBA1C 5.2 08/15/2023      Assessment & Plan:   Problem List Items Addressed This Visit       Other   Urinary frequency    Condition remains unchanged Currently on Ditropan 10 mg daily Start Ditropan 15 mg daily, after 1 week increase to 20 mg daily at bedtime Patient referred to urology Follow-up in 3 months      Relevant Medications   oxybutynin (DITROPAN XL) 5 MG 24 hr tablet   Chronic tension-type headache, not intractable    Well-controlled on Pamelor 10 mg daily at bedtime Continue current medication      Relevant Medications   nortriptyline (PAMELOR) 10 MG capsule   Other Visit Diagnoses     Overactive bladder    -  Primary   Relevant Medications   oxybutynin (DITROPAN XL) 5 MG 24 hr tablet   Other Relevant Orders   Ambulatory referral to Urology       Meds ordered this encounter  Medications   oxybutynin (DITROPAN XL) 5 MG 24 hr tablet    Sig: Take 1 tablet (5 mg total) by mouth at bedtime.    Dispense:  30 tablet    Refill:  0   nortriptyline (PAMELOR) 10 MG capsule    Sig: Take 1 capsule (10 mg total) by mouth at bedtime.    Dispense:  90 capsule    Refill:  1    Follow-up: Return in about 3 months (around 12/17/2023) for CPE.    Donell Beers, FNP

## 2023-09-16 NOTE — Assessment & Plan Note (Addendum)
Condition remains unchanged Currently on Ditropan 10 mg daily Start Ditropan 15 mg daily, after 1 week increase to 20 mg daily at bedtime Patient referred to urology Follow-up in 3 months

## 2023-09-16 NOTE — Patient Instructions (Addendum)
1. Overactive bladder  Please take Ditropan 15 mg at bedtime for 1 week.  After 1 week increase to 20 mg daily I have referred you to urology   It is important that you exercise regularly at least 30 minutes 5 times a week as tolerated  Think about what you will eat, plan ahead. Choose " clean, green, fresh or frozen" over canned, processed or packaged foods which are more sugary, salty and fatty. 70 to 75% of food eaten should be vegetables and fruit. Three meals at set times with snacks allowed between meals, but they must be fruit or vegetables. Aim to eat over a 12 hour period , example 7 am to 7 pm, and STOP after  your last meal of the day. Drink water,generally about 64 ounces per day, no other drink is as healthy. Fruit juice is best enjoyed in a healthy way, by EATING the fruit.  Thanks for choosing [facility] we consider it a privelige to serve you.

## 2023-09-23 DIAGNOSIS — Z419 Encounter for procedure for purposes other than remedying health state, unspecified: Secondary | ICD-10-CM | POA: Diagnosis not present

## 2023-10-23 DIAGNOSIS — Z419 Encounter for procedure for purposes other than remedying health state, unspecified: Secondary | ICD-10-CM | POA: Diagnosis not present

## 2023-10-27 ENCOUNTER — Telehealth: Payer: Self-pay | Admitting: Nurse Practitioner

## 2023-10-27 NOTE — Telephone Encounter (Signed)
Caller & Relationship to patient:  Self MRN #  [id number]   Call Back Number:   Date of Last Office Visit: 10/27/2023     Date of Next Office Visit: 10/31/2023    Medication(s) to be Refilled: Ditropan 5mg  and 10mg   Preferred Pharmacy: [facility]  ** Please notify patient to allow 48-72 hours to process** **Let patient know to contact pharmacy at the end of the day to make sure medication is ready. ** **If patient has not been seen in a year or longer, book an appointment **Advise to use MyChart for refill requests OR to contact their pharmacy

## 2023-10-27 NOTE — Telephone Encounter (Signed)
Copied from CRM [id number]. Topic: Clinical - Medication Refill >> Oct 27, 2023 11:12 AM Thomes Dinning wrote: Most Recent Primary Care Visit:  Provider: Donell Beers  Department: [facility]  Visit Type: OFFICE VISIT  Date: 09/16/2023  Medication:  oxybutynin (DITROPAN-XL) 10 MG 24 hr tablet nortriptyline (PAMELOR) 10 MG capsule   Has the patient contacted their pharmacy?  (Agent: If no, request that the patient contact the pharmacy for the refill. If patient does not wish to contact the pharmacy document the reason why and proceed with request.) (Agent: If yes, when and what did the pharmacy advise?)  Is this the correct pharmacy for this prescription? Yes If no, delete pharmacy and type the correct one.  This is the patient's preferred pharmacy:  [id number] [place], [place] - [address] AT [facility] [address] [place] [address] Phone: [phone] Fax: [phone]   Has the prescription been filled recently? No  Is the patient out of the medication? Yes  Has the patient been seen for an appointment in the last year OR does the patient have an upcoming appointment? Yes  Can we respond through MyChart? No  Agent: Please be advised that Rx refills may take up to 3 business days. We ask that you follow-up with your pharmacy.

## 2023-10-28 ENCOUNTER — Other Ambulatory Visit (HOSPITAL_COMMUNITY): Payer: Self-pay | Admitting: Nurse Practitioner

## 2023-10-28 ENCOUNTER — Ambulatory Visit: Payer: Self-pay

## 2023-10-28 DIAGNOSIS — G44229 Chronic tension-type headache, not intractable: Secondary | ICD-10-CM

## 2023-10-28 DIAGNOSIS — N3281 Overactive bladder: Secondary | ICD-10-CM

## 2023-10-28 MED ORDER — OXYBUTYNIN CHLORIDE ER 10 MG PO TB24: 10.0000 mg | ORAL_TABLET | Freq: Every day | ORAL | 1 refills | Status: DC

## 2023-10-28 MED ORDER — NORTRIPTYLINE HCL 10 MG PO CAPS: 10.0000 mg | ORAL_CAPSULE | Freq: Every day | ORAL | 1 refills | Status: DC

## 2023-10-28 MED ORDER — OXYBUTYNIN CHLORIDE ER 5 MG PO TB24: 5.0000 mg | ORAL_TABLET | Freq: Every day | ORAL | 1 refills | Status: AC

## 2023-10-31 ENCOUNTER — Encounter (HOSPITAL_COMMUNITY): Payer: Self-pay

## 2023-10-31 ENCOUNTER — Ambulatory Visit (HOSPITAL_COMMUNITY)
Admission: EM | Admit: 2023-10-31 | Discharge: 2023-10-31 | Disposition: A | Discharge status: Home / Self Care | Payer: Medicaid Other | Attending: Family Medicine | Admitting: Family Medicine

## 2023-10-31 ENCOUNTER — Ambulatory Visit: Payer: Self-pay | Admitting: Nurse Practitioner

## 2023-10-31 DIAGNOSIS — G44209 Tension-type headache, unspecified, not intractable: Secondary | ICD-10-CM | POA: Diagnosis not present

## 2023-10-31 DIAGNOSIS — G44229 Chronic tension-type headache, not intractable: Secondary | ICD-10-CM

## 2023-10-31 MED ORDER — NORTRIPTYLINE HCL 10 MG PO CAPS: 10.0000 mg | ORAL_CAPSULE | Freq: Every day | ORAL | 0 refills | Status: DC

## 2023-10-31 MED ORDER — KETOROLAC TROMETHAMINE 30 MG/ML IJ SOLN
INTRAMUSCULAR | Status: AC
  Filled 2023-10-31: qty 1

## 2023-10-31 MED ORDER — KETOROLAC TROMETHAMINE 30 MG/ML IJ SOLN
30.0000 mg | Freq: Once | INTRAMUSCULAR | Status: AC
  Administered 2023-10-31: 30 mg via INTRAMUSCULAR

## 2023-10-31 MED ORDER — OFLOXACIN 0.3 % OT SOLN: 5.0000 [drp] | Freq: Two times a day (BID) | OTIC | 0 refills | Status: AC

## 2023-10-31 NOTE — ED Triage Notes (Signed)
Patient here today with c/o headache since last Saturday and left ear pain X 3 months after flying back from [place].

## 2023-10-31 NOTE — ED Provider Notes (Signed)
[facility]    CSN: [id number] Arrival date & time: 10/31/23  1651      History   Chief Complaint Chief Complaint  Patient presents with   Otalgia   Headache    HPI Matthew Barajas is a 42 y.o. male.    Otalgia Associated symptoms: headaches   Headache Associated symptoms: ear pain   Here for posterior headache that has been radiating up to the vertex of his head.  It began on December 7.  He has been out of his nortriptyline that he was taking for chronic headache prophylaxis for about 2 weeks.  He states that he is unaware if his clinic has refilled his medication.  No fever or cough or congestion  He also states he has had 3 months of bilateral ear pain since flying back from [place].    Past Medical History:  Diagnosis Date   Blurry vision, bilateral 08/15/2023   Chronic tension-type headache, not intractable 08/15/2023   Urinary frequency 08/15/2023    Patient Active Problem List   Diagnosis Date Noted   Urinary frequency 08/15/2023   Chronic tension-type headache, not intractable 08/15/2023   Blurry vision, bilateral 08/15/2023    History reviewed. No pertinent surgical history.     Home Medications    Prior to Admission medications   Medication Sig Start Date End Date Taking? Authorizing Provider  ofloxacin (FLOXIN) 0.3 % OTIC solution Place 5 drops into both ears 2 (two) times daily for 7 days. 10/31/23 11/07/23 Yes Zenia Resides, MD  cetirizine (ZYRTEC ALLERGY) 10 MG tablet Take 1 tablet (10 mg total) by mouth daily. Patient not taking: Reported on 09/16/2023 08/05/23   Wynonia Lawman A, NP  montelukast (SINGULAIR) 10 MG tablet Take by mouth. 06/28/23 07/28/23  [provider]  nortriptyline (PAMELOR) 10 MG capsule Take 1 capsule (10 mg total) by mouth at bedtime. 10/31/23   Zenia Resides, MD  oxybutynin (DITROPAN XL) 5 MG 24 hr tablet Take 1 tablet (5 mg total) by mouth at bedtime. 10/28/23   Donell Beers, FNP  oxybutynin  (DITROPAN-XL) 10 MG 24 hr tablet Take 1 tablet (10 mg total) by mouth at bedtime. 10/28/23   Donell Beers, FNP    Family History History reviewed. No pertinent family history.  Social History Social History   Tobacco Use   Smoking status: Never   Smokeless tobacco: Never  Vaping Use   Vaping status: Never Used  Substance Use Topics   Alcohol use: No   Drug use: No     Allergies   Patient has no known allergies.   Review of Systems Review of Systems  HENT:  Positive for ear pain.   Neurological:  Positive for headaches.     Physical Exam Triage Vital Signs ED Triage Vitals  Encounter Vitals Group     BP 10/31/23 1833 125/88     Systolic BP Percentile --      Diastolic BP Percentile --      Pulse Rate 10/31/23 1833 86     Resp 10/31/23 1833 16     Temp 10/31/23 1833 98.4 F (36.9 C)     Temp Source 10/31/23 1833 Oral     SpO2 10/31/23 1833 97 %     Weight 10/31/23 1832 154 lb (69.9 kg)     Height 10/31/23 1832 5\' 6"  (1.676 m)     Head Circumference --      Peak Flow --      Pain  Score 10/31/23 1831 6     Pain Loc --      Pain Education --      Exclude from Growth Chart --    No data found.  Updated Vital Signs BP 125/88 (BP Location: Left Arm)   Pulse 86   Temp 98.4 F (36.9 C) (Oral)   Resp 16   Ht 5\' 6"  (1.676 m)   Wt 69.9 kg   SpO2 97%   BMI 24.86 kg/m   Visual Acuity Right Eye Distance:   Left Eye Distance:   Bilateral Distance:    Right Eye Near:   Left Eye Near:    Bilateral Near:     Physical Exam Vitals reviewed.  Constitutional:      General: He is not in acute distress.    Appearance: He is not ill-appearing, toxic-appearing or diaphoretic.  HENT:     Ears:     Comments: Tympanic membrane's are bilaterally gray and shiny.  There is some white discharge near the tympanic membrane in the left ear canal.  And there is some scant white discharge in the right ear canal.  Canal wall is not swollen    Nose: Nose normal.      Mouth/Throat:     Mouth: Mucous membranes are moist.     Pharynx: No oropharyngeal exudate or posterior oropharyngeal erythema.  Eyes:     Extraocular Movements: Extraocular movements intact.     Conjunctiva/sclera: Conjunctivae normal.     Pupils: Pupils are equal, round, and reactive to light.  Cardiovascular:     Rate and Rhythm: Normal rate and regular rhythm.     Heart sounds: No murmur heard. Pulmonary:     Effort: Pulmonary effort is normal. No respiratory distress.     Breath sounds: Normal breath sounds. No stridor. No wheezing, rhonchi or rales.  Musculoskeletal:     Cervical back: Neck supple.  Lymphadenopathy:     Cervical: No cervical adenopathy.  Skin:    Capillary Refill: Capillary refill takes less than 2 seconds.     Coloration: Skin is not jaundiced or pale.  Neurological:     General: No focal deficit present.     Mental Status: He is alert and oriented to person, place, and time.  Psychiatric:        Behavior: Behavior normal.      UC Treatments / Results  Labs (all labs ordered are listed, but only abnormal results are displayed) Labs Reviewed - No data to display  EKG   Radiology No results found.  Procedures Procedures (including critical care time)  Medications Ordered in UC Medications  ketorolac (TORADOL) 30 MG/ML injection 30 mg (has no administration in time range)    Initial Impression / Assessment and Plan / UC Course  I have reviewed the triage vital signs and the nursing notes.  Pertinent labs & imaging results that were available during my care of the patient were reviewed by me and considered in my medical decision making (see chart for details).     I want to treat him with Floxin for otitis externa.  Prescription for his nortriptyline is sent to the pharmacy for a month supply.  And he is given Toradol for the pain today.  Final Clinical Impressions(s) / UC Diagnoses   Final diagnoses:  Acute non intractable tension-type  headache     Discharge Instructions      -Floxin eardrops-5 drops in the affected ear 2 times daily for 7 days.  You  have been given a shot of Toradol 30 mg today.  I have sent a prescription for 30-day supply for your nortriptyline.  I can tell that you are provider has already sent a prescription on December 6.  I recommend you try picking up that 90-day supply that is already been sent for you.     ED Prescriptions     Medication Sig Dispense Auth. Provider   nortriptyline (PAMELOR) 10 MG capsule Take 1 capsule (10 mg total) by mouth at bedtime. 30 capsule Zenia Resides, MD   ofloxacin (FLOXIN) 0.3 % OTIC solution Place 5 drops into both ears 2 (two) times daily for 7 days. 5 mL Zenia Resides, MD      PDMP not reviewed this encounter.   Zenia Resides, MD 10/31/23 1900

## 2023-10-31 NOTE — Discharge Instructions (Signed)
-  Floxin eardrops-5 drops in the affected ear 2 times daily for 7 days.  You have been given a shot of Toradol 30 mg today.  I have sent a prescription for 30-day supply for your nortriptyline.  I can tell that you are provider has already sent a prescription on December 6.  I recommend you try picking up that 90-day supply that is already been sent for you.

## 2023-11-14 ENCOUNTER — Encounter: Payer: Self-pay | Admitting: Nurse Practitioner

## 2023-11-14 ENCOUNTER — Ambulatory Visit (INDEPENDENT_AMBULATORY_CARE_PROVIDER_SITE_OTHER): Payer: Medicaid Other | Admitting: Nurse Practitioner

## 2023-11-14 VITALS — BP 127/99 | HR 90 | Temp 97.2°F | Wt 172.0 lb

## 2023-11-14 DIAGNOSIS — G44229 Chronic tension-type headache, not intractable: Secondary | ICD-10-CM

## 2023-11-14 DIAGNOSIS — H9192 Unspecified hearing loss, left ear: Secondary | ICD-10-CM | POA: Diagnosis not present

## 2023-11-14 DIAGNOSIS — R350 Frequency of micturition: Secondary | ICD-10-CM | POA: Diagnosis not present

## 2023-11-14 DIAGNOSIS — N3281 Overactive bladder: Secondary | ICD-10-CM | POA: Insufficient documentation

## 2023-11-14 MED ORDER — NORTRIPTYLINE HCL 10 MG PO CAPS: 10.0000 mg | ORAL_CAPSULE | Freq: Every day | ORAL | 1 refills | Status: AC

## 2023-11-14 NOTE — Patient Instructions (Addendum)
I have referred you to [facility] urology  [facility]  [address], [place], [place] [address]  [phone]   1. Overactive bladder (Primary)  - Ambulatory referral to Urology  2. Chronic tension-type headache, not intractable  - nortriptyline (PAMELOR) 10 MG capsule; Take 1 capsule (10 mg total) by mouth at bedtime.  Dispense: 90 capsule; Refill: 1  3. Hearing trouble, left  - Ambulatory referral to ENT    It is important that you exercise regularly at least 30 minutes 5 times a week as tolerated  Think about what you will eat, plan ahead. Choose " clean, green, fresh or frozen" over canned, processed or packaged foods which are more sugary, salty and fatty. 70 to 75% of food eaten should be vegetables and fruit. Three meals at set times with snacks allowed between meals, but they must be fruit or vegetables. Aim to eat over a 12 hour period , example 7 am to 7 pm, and STOP after  your last meal of the day. Drink water,generally about 64 ounces per day, no other drink is as healthy. Fruit juice is best enjoyed in a healthy way, by EATING the fruit.  Thanks for choosing [facility] we consider it a privelige to serve you.

## 2023-11-14 NOTE — Assessment & Plan Note (Signed)
New referral sent to urology at [facility] urology their phone number and address provided encouraged to call the office in 2 to 3 weeks. Continue Ditropan 15 mg daily

## 2023-11-14 NOTE — Progress Notes (Signed)
Acute Office Visit  Subjective:     Patient ID: Matthew Barajas, male    DOB: 06/16/1981, 42 y.o.   MRN: [id number]  Chief Complaint  Patient presents with   Ear Pain    HPI Matthew Barajas  has a past medical history of Blurry vision, bilateral (08/15/2023), Chronic tension-type headache, not intractable (08/15/2023), and Urinary frequency (08/15/2023).  Patient presents with complaints of trouble hearing on the left side.  He was recently treated for ear infection with ofloxacin eardrops.  Patient currently denies ear pain, ear discharge, headaches.  But he has decreased hearing in the left ear .   He also continues to have urinary frequency he was referred to urologist but he did not follow-up.  Currently on Ditropan 15 mg daily.     Review of Systems  Constitutional:  Negative for appetite change, chills, fatigue and fever.  HENT:  Positive for hearing loss. Negative for congestion, postnasal drip, rhinorrhea and sneezing.   Respiratory:  Negative for cough, shortness of breath and wheezing.   Cardiovascular:  Negative for chest pain, palpitations and leg swelling.  Gastrointestinal:  Negative for abdominal pain, constipation, nausea and vomiting.  Genitourinary:  Positive for frequency. Negative for difficulty urinating, dysuria and flank pain.  Musculoskeletal:  Negative for arthralgias, back pain, joint swelling and myalgias.  Skin:  Negative for color change, pallor, rash and wound.  Neurological:  Negative for dizziness, facial asymmetry, weakness, numbness and headaches.  Psychiatric/Behavioral:  Negative for behavioral problems, confusion, self-injury and suicidal ideas.         Objective:    BP (!) 127/99   Pulse 90   Temp (!) 97.2 F (36.2 C)   Wt 172 lb (78 kg)   SpO2 98%   BMI 27.76 kg/m    Physical Exam Vitals and nursing note reviewed.  Constitutional:      General: He is not in acute distress.    Appearance: Normal appearance. He is not ill-appearing,  toxic-appearing or diaphoretic.  HENT:     Right Ear: Tympanic membrane, ear canal and external ear normal. There is no impacted cerumen.     Left Ear: Tympanic membrane, ear canal and external ear normal. There is no impacted cerumen.     Mouth/Throat:     Mouth: Mucous membranes are moist.     Pharynx: Oropharynx is clear. No oropharyngeal exudate or posterior oropharyngeal erythema.  Eyes:     General: No scleral icterus.       Right eye: No discharge.        Left eye: No discharge.     Extraocular Movements: Extraocular movements intact.     Conjunctiva/sclera: Conjunctivae normal.  Cardiovascular:     Rate and Rhythm: Normal rate and regular rhythm.     Pulses: Normal pulses.     Heart sounds: Normal heart sounds. No murmur heard.    No friction rub. No gallop.  Pulmonary:     Effort: Pulmonary effort is normal. No respiratory distress.     Breath sounds: Normal breath sounds. No stridor. No wheezing, rhonchi or rales.  Chest:     Chest wall: No tenderness.  Abdominal:     General: There is no distension.     Palpations: Abdomen is soft.     Tenderness: There is no abdominal tenderness. There is no right CVA tenderness, left CVA tenderness or guarding.  Musculoskeletal:        General: No swelling, tenderness, deformity or signs of injury.  Right lower leg: No edema.     Left lower leg: No edema.  Skin:    General: Skin is warm and dry.     Capillary Refill: Capillary refill takes less than 2 seconds.     Coloration: Skin is not jaundiced or pale.     Findings: No bruising, erythema or lesion.  Neurological:     Mental Status: He is alert and oriented to person, place, and time.     Motor: No weakness.     Coordination: Coordination normal.     Gait: Gait normal.  Psychiatric:        Mood and Affect: Mood normal.        Behavior: Behavior normal.        Thought Content: Thought content normal.        Judgment: Judgment normal.     No results found for any  visits on 11/14/23.      Assessment & Plan:   Problem List Items Addressed This Visit       Nervous and Auditory   Hearing trouble, left   Patient referred to ENT      Relevant Orders   Ambulatory referral to Urology   Ambulatory referral to ENT     Other   Urinary frequency - Primary   New referral sent to urology at [facility] their phone number and address provided encouraged to call the office in 2 to 3 weeks. Continue Ditropan 15 mg daily      Relevant Orders   Ambulatory referral to Urology   Chronic tension-type headache, not intractable   Well-controlled on Pamelor 10 mg at bedtime Continue current medication      Relevant Medications   nortriptyline (PAMELOR) 10 MG capsule    Meds ordered this encounter  Medications   nortriptyline (PAMELOR) 10 MG capsule    Sig: Take 1 capsule (10 mg total) by mouth at bedtime.    Dispense:  90 capsule    Refill:  1    No follow-ups on file.  Donell Beers, FNP

## 2023-11-14 NOTE — Assessment & Plan Note (Signed)
Patient referred to ENT.

## 2023-11-14 NOTE — Assessment & Plan Note (Signed)
Well-controlled on Pamelor 10 mg at bedtime Continue current medication

## 2023-11-23 DIAGNOSIS — Z419 Encounter for procedure for purposes other than remedying health state, unspecified: Secondary | ICD-10-CM | POA: Diagnosis not present

## 2023-12-16 ENCOUNTER — Ambulatory Visit: Payer: Self-pay | Admitting: Nurse Practitioner

## 2023-12-22 ENCOUNTER — Telehealth (INDEPENDENT_AMBULATORY_CARE_PROVIDER_SITE_OTHER): Payer: Self-pay | Admitting: Otolaryngology

## 2023-12-22 NOTE — Telephone Encounter (Signed)
Reminder Call: Date: 12/23/2023 Status: Sch  Time: 2:30 PM [address] [place], [place] [address]  Left voicemail w/time and location.

## 2023-12-23 ENCOUNTER — Ambulatory Visit (INDEPENDENT_AMBULATORY_CARE_PROVIDER_SITE_OTHER): Payer: Medicaid Other | Admitting: Otolaryngology

## 2023-12-23 ENCOUNTER — Encounter (INDEPENDENT_AMBULATORY_CARE_PROVIDER_SITE_OTHER): Payer: Self-pay

## 2023-12-23 VITALS — BP 124/89 | HR 89 | Ht 65.0 in

## 2023-12-23 DIAGNOSIS — H938X2 Other specified disorders of left ear: Secondary | ICD-10-CM | POA: Diagnosis not present

## 2023-12-23 DIAGNOSIS — H7311 Chronic myringitis, right ear: Secondary | ICD-10-CM | POA: Diagnosis not present

## 2023-12-23 DIAGNOSIS — H900 Conductive hearing loss, bilateral: Secondary | ICD-10-CM | POA: Diagnosis not present

## 2023-12-23 DIAGNOSIS — H7292 Unspecified perforation of tympanic membrane, left ear: Secondary | ICD-10-CM

## 2023-12-23 DIAGNOSIS — H6993 Unspecified Eustachian tube disorder, bilateral: Secondary | ICD-10-CM

## 2023-12-23 MED ORDER — OFLOXACIN 0.3 % OT SOLN: 5.0000 [drp] | Freq: Two times a day (BID) | OTIC | 1 refills | Status: AC

## 2023-12-23 NOTE — Progress Notes (Signed)
 Dear Dr. Geoffery Spruce, Here is my assessment for our mutual patient, Matthew Barajas. Thank you for allowing me the opportunity to care for your patient. Please do not hesitate to contact me should you have any other questions. Sincerely, Dr. Jovita Kussmaul  Otolaryngology Clinic Note Referring provider: Dr. Geoffery Spruce HPI:  Matthew Barajas is a 43 y.o. male kindly referred by Dr. Geoffery Spruce for evaluation of hearing loss.  Initial visit (11/2023): Patient reports: he reports that he has had some hearing trouble since last August 2024 on left - before then no problems with left ear. He was traveling from [place] and then had decreased hearing and some fullness in the airplane ("something stuck in there"). Since then he has had some on and off trouble with the ear. He was dx with an ear infection on that side, and was prescribed amoxicillin and ear drops. He reports some improvement with it, but reports that he continues to have ongoing symptoms. He reports that his symptoms include fullness and feels like his ear drum is popping. No buzzing, no pulsatile tinnitus. No drainage from ears. Some intermittent pain.  He reports that he hears about 80% on right side - has had for a long time (when he gets sick, he has some fullness). Nothing makes it better or worse. Not like left. Patient currently denies: ear pain, fullness, vertigo, drainage, tinnitus Patient also denies barotrauma, vestibular suppressant use, ototoxic medication use Prior ear surgery: no  No frequent CRS or other nasal sx.  H&N Surgery: denies Personal or FHx of bleeding dz or anesthesia difficulty: no  Tobacco: no.  Independent Review of Additional Tests or Records:  Dr. Geoffery Spruce (NP) 09/16/2023 and 11/14/2023: He was recently treated for ear infection on left with ofloxacin eardrops.  Patient currently denies ear pain, ear discharge, headaches.  But he has decreased hearing in the left ear; Dx: hearing loss, Rx: ref ENT CBC and CMP 08/15/2023: WBC 8.5, Plt  316, Eos 0; CMP showing kidney markers w/o elevation (BUN/Cr 13) CTH 12/14/2012 independently interpreted and reviewed with respect to ears: cuts thick so suboptimal but appears as if right ear sclerotic mastoid with fluid and query perforation; otic capsule normal but ossicles unable to viualize well; left ear mastoid and ME well aerated with ossicles appearing nrmal and no obvious otic capsule abnormalities  PMH/Meds/All/SocHx/FamHx/ROS:   Past Medical History:  Diagnosis Date   Blurry vision, bilateral 08/15/2023   Chronic tension-type headache, not intractable 08/15/2023   Urinary frequency 08/15/2023     History reviewed. No pertinent surgical history.  History reviewed. No pertinent family history.   Social Connections: Not on file      Current Outpatient Medications:    nortriptyline (PAMELOR) 10 MG capsule, Take 1 capsule (10 mg total) by mouth at bedtime., Disp: 90 capsule, Rfl: 1   oxybutynin (DITROPAN XL) 5 MG 24 hr tablet, Take 1 tablet (5 mg total) by mouth at bedtime., Disp: 90 tablet, Rfl: 1   oxybutynin (DITROPAN-XL) 10 MG 24 hr tablet, Take 1 tablet (10 mg total) by mouth at bedtime., Disp: 90 tablet, Rfl: 1   cetirizine (ZYRTEC ALLERGY) 10 MG tablet, Take 1 tablet (10 mg total) by mouth daily. (Patient not taking: Reported on 12/23/2023), Disp: 30 tablet, Rfl: 0   montelukast (SINGULAIR) 10 MG tablet, Take by mouth. (Patient not taking: Reported on 11/14/2023), Disp: , Rfl:    Physical Exam:   BP 124/89 (BP Location: Right Arm, Cuff Size: Normal)   Pulse 89   Ht  5\' 5"  (1.651 m)   SpO2 94%   BMI 28.62 kg/m   Salient findings:  CN II-XII intact Given history and complaints, ear microscopy was indicated and performed for evaluation with findings as below in physical exam section and in procedures  Bilateral EAC clear - noted Left tm perforation -- clean, no evidence of cholesteatoma; 20% anterior; Right ear - some myringitis superiorly so unable to assess TM  comprehensively and query some retraction as well globally; no evidence of cholesteatoma of frank epithelial debris; no postauricular scar.  Weber 512: mid Rinne 512: AC > BC b/l  Anterior rhinoscopy: Septum intact; bilateral inferior turbinates without significant hypertrophy No lesions of oral cavity/oropharynx; No obviously palpable neck masses/lymphadenopathy/thyromegaly No respiratory distress or stridor  Seprately Identifiable Procedures:  Procedure: Bilateral ear microscopy using microscope (CPT 92504) Pre-procedure diagnosis: tympanic membrane perforation, hearing loss Post-procedure diagnosis: same Indication: Concern for tympanic membrane perforation and hearing loss; given patient's otologic complaints and history, for improved and comprehensive examination of external ear and tympanic membrane, bilateral otologic examination using microscope was performed  Procedure: Patient was placed semi-recumbent. Both ear canals were examined using the microscope with findings above. Some cerumen removed on left and on right using suction and currette.  Patient tolerated the procedure well.   Impression & Plans:  [name] is a 43 y.o. male with:  1. Perforation of left tympanic membrane   2. Chronic myringitis, right   3. Conductive hearing loss, bilateral   4. Sensation of fullness in left ear   5. Dysfunction of both eustachian tubes    Somewhat complex ear situation in that his problems seem to be on the left, but clearly does have right chronic problems based on CT as well and his hearing.  Left ear has perforation and right with myringitis currently and some thickening so cannot adequately assess ME status. We discussed his options and discussed need for safe/dry, and then hearing ear. Will proceed with medical management and then get CT Temporal bones  -- Dry ear precautions -- Ofloxacin drops both ears BID x14d - CT Temporal bones ordered -- Audiogram ordered - f/u 6 weeks  with CT temporal bones  See below regarding exact medications prescribed this encounter including dosages and route: Meds ordered this encounter  Medications   ofloxacin (FLOXIN) 0.3 % OTIC solution    Sig: Place 5 drops into both ears 2 (two) times daily for 14 days.    Dispense:  10 mL    Refill:  1      Thank you for allowing me the opportunity to care for your patient. Please do not hesitate to contact me should you have any other questions.  Sincerely, Jovita Kussmaul, MD Otolaryngologist (ENT), [facility] Phone: [phone] Fax: [phone]  01/24/2024, 12:18 PM   MDM:  Level 4 Complexity/Problems addressed: mod - new problem, chronic problems Data complexity: high - independent review of notes, labs, ordering tests; independent interpretation of testing - Morbidity: mod  - Prescription Drug prescribed or managed: yes

## 2023-12-23 NOTE — Patient Instructions (Addendum)
I have ordered an imaging study for you to complete prior to your next visit. Please call [facility] Scheduling at [phone] to schedule your imaging if you have not received a call within 24 hours. If you are unable to complete your imaging study prior to your next scheduled visit please call our office to let us know.    Use ofloxacin ear drops (4 drops twice per daily in both ears) for 14 days  Keep ear dry. When you shower, put a cotton ball in the ears.

## 2023-12-24 DIAGNOSIS — Z419 Encounter for procedure for purposes other than remedying health state, unspecified: Secondary | ICD-10-CM | POA: Diagnosis not present

## 2024-01-13 ENCOUNTER — Ambulatory Visit: Payer: Self-pay | Admitting: Nurse Practitioner

## 2024-01-21 DIAGNOSIS — Z419 Encounter for procedure for purposes other than remedying health state, unspecified: Secondary | ICD-10-CM | POA: Diagnosis not present

## 2024-02-09 ENCOUNTER — Ambulatory Visit (INDEPENDENT_AMBULATORY_CARE_PROVIDER_SITE_OTHER): Payer: Medicaid Other

## 2024-02-09 ENCOUNTER — Ambulatory Visit (INDEPENDENT_AMBULATORY_CARE_PROVIDER_SITE_OTHER): Payer: Medicaid Other | Admitting: Audiology

## 2024-03-03 DIAGNOSIS — Z419 Encounter for procedure for purposes other than remedying health state, unspecified: Secondary | ICD-10-CM | POA: Diagnosis not present

## 2024-03-23 ENCOUNTER — Ambulatory Visit: Payer: Self-pay

## 2024-03-23 NOTE — Telephone Encounter (Signed)
 Summary: Frequent urination, seeking appt today.   Copied From CRM [id number]. Reason for Triage: Frequent urination  Best contact: [phone]  Wants to be seen today, seeking referral.  Says this is keeping him up at night, almost every hour. 5 times a night.      Chief Complaint: urinary frequency Symptoms: urinary frequency  Frequency: worse at night Pertinent Negatives: Patient denies pain or urina2 Disposition: [] ED /[] Urgent Care (no appt availability in office) / [x] Appointment(In office/virtual)/ []  [facility]/ [] Home Care/ [] Refused Recommended Disposition /[] [facility]/ []  Follow-up with PCP Additional Notes: Patient reports urinary frequency off/on x 3 years, sttes that its worse at night. Appt scheduled for 5/23, pt added to wait list per request  Reason for Disposition  Has to get out of bed to urinate > 2 times a night (i.e., nocturia)  Answer Assessment - Initial Assessment Questions 1. SYMPTOM: "What's the main symptom you're concerned about?" (e.g., frequency, incontinence)     Frequent urination  2. ONSET: "When did the  symptoms  start?"     Off/on for 3 years worse at night  3. PAIN: "Is there any pain?" If Yes, ask: "How bad is it?" (Scale: 1-10; mild, moderate, severe)     No  4. CAUSE: "What do you think is causing the symptoms?"     Unsure of cause  5. OTHER SYMPTOMS: "Do you have any other symptoms?" (e.g., blood in urine, fever, flank pain, pain with urination)     No  Protocols used: Urinary Symptoms-A-AH

## 2024-04-02 DIAGNOSIS — G4709 Other insomnia: Secondary | ICD-10-CM | POA: Diagnosis not present

## 2024-04-02 DIAGNOSIS — I10 Essential (primary) hypertension: Secondary | ICD-10-CM | POA: Diagnosis not present

## 2024-04-02 DIAGNOSIS — R350 Frequency of micturition: Secondary | ICD-10-CM | POA: Diagnosis not present

## 2024-04-02 DIAGNOSIS — Z419 Encounter for procedure for purposes other than remedying health state, unspecified: Secondary | ICD-10-CM | POA: Diagnosis not present

## 2024-04-03 DIAGNOSIS — R1010 Upper abdominal pain, unspecified: Secondary | ICD-10-CM | POA: Diagnosis not present

## 2024-04-03 DIAGNOSIS — E786 Lipoprotein deficiency: Secondary | ICD-10-CM | POA: Diagnosis not present

## 2024-04-12 DIAGNOSIS — I10 Essential (primary) hypertension: Secondary | ICD-10-CM | POA: Diagnosis not present

## 2024-04-12 DIAGNOSIS — Z0000 Encounter for general adult medical examination without abnormal findings: Secondary | ICD-10-CM | POA: Diagnosis not present

## 2024-04-12 DIAGNOSIS — E7849 Other hyperlipidemia: Secondary | ICD-10-CM | POA: Diagnosis not present

## 2024-04-13 ENCOUNTER — Ambulatory Visit: Payer: Self-pay | Admitting: Nurse Practitioner

## 2024-05-03 DIAGNOSIS — Z419 Encounter for procedure for purposes other than remedying health state, unspecified: Secondary | ICD-10-CM | POA: Diagnosis not present

## 2024-06-02 DIAGNOSIS — Z419 Encounter for procedure for purposes other than remedying health state, unspecified: Secondary | ICD-10-CM | POA: Diagnosis not present

## 2024-06-08 ENCOUNTER — Ambulatory Visit: Payer: Self-pay | Admitting: Nurse Practitioner

## 2024-07-03 DIAGNOSIS — Z419 Encounter for procedure for purposes other than remedying health state, unspecified: Secondary | ICD-10-CM | POA: Diagnosis not present

## 2024-08-03 DIAGNOSIS — Z419 Encounter for procedure for purposes other than remedying health state, unspecified: Secondary | ICD-10-CM | POA: Diagnosis not present

## 2024-08-20 ENCOUNTER — Other Ambulatory Visit: Payer: Self-pay | Admitting: Nurse Practitioner

## 2024-08-20 DIAGNOSIS — N3281 Overactive bladder: Secondary | ICD-10-CM

## 2024-11-02 DIAGNOSIS — Z419 Encounter for procedure for purposes other than remedying health state, unspecified: Secondary | ICD-10-CM | POA: Diagnosis not present
